# Patient Record
Sex: Female | Born: 1945 | Hispanic: Yes | Marital: Single | Smoking: Never smoker
Health system: Southern US, Community
[De-identification: ages and names within clinical notes are randomized; demographics above are authoritative.]

## PROBLEM LIST (undated history)

## (undated) DIAGNOSIS — E119 Type 2 diabetes mellitus without complications: Secondary | ICD-10-CM

## (undated) DIAGNOSIS — M1990 Unspecified osteoarthritis, unspecified site: Secondary | ICD-10-CM

## (undated) DIAGNOSIS — K219 Gastro-esophageal reflux disease without esophagitis: Secondary | ICD-10-CM

## (undated) DIAGNOSIS — I10 Essential (primary) hypertension: Secondary | ICD-10-CM

## (undated) HISTORY — DX: Gastro-esophageal reflux disease without esophagitis: K21.9

## (undated) HISTORY — PX: HERNIA REPAIR: SHX51

## (undated) HISTORY — DX: Essential (primary) hypertension: I10

## (undated) HISTORY — DX: Unspecified osteoarthritis, unspecified site: M19.90

---

## 2009-07-30 ENCOUNTER — Emergency Department (HOSPITAL_COMMUNITY): Admission: EM | Admit: 2009-07-30 | Discharge: 2009-07-30 | Payer: Self-pay | Admitting: Emergency Medicine

## 2009-11-15 ENCOUNTER — Emergency Department (HOSPITAL_COMMUNITY): Admission: EM | Admit: 2009-11-15 | Discharge: 2009-11-15 | Payer: Self-pay | Admitting: Family Medicine

## 2009-12-25 ENCOUNTER — Encounter (INDEPENDENT_AMBULATORY_CARE_PROVIDER_SITE_OTHER): Payer: Self-pay | Admitting: Family Medicine

## 2009-12-25 ENCOUNTER — Ambulatory Visit: Payer: Self-pay | Admitting: Internal Medicine

## 2009-12-25 LAB — CONVERTED CEMR LAB
ALT: 24 units/L (ref 0–35)
AST: 23 units/L (ref 0–37)
Alkaline Phosphatase: 100 units/L (ref 39–117)
BUN: 21 mg/dL (ref 6–23)
Basophils Relative: 0 % (ref 0–1)
Calcium: 9.1 mg/dL (ref 8.4–10.5)
Cholesterol: 186 mg/dL (ref 0–200)
Creatinine, Ser: 0.84 mg/dL (ref 0.40–1.20)
Eosinophils Absolute: 0.1 10*3/uL (ref 0.0–0.7)
Glucose, Bld: 102 mg/dL — ABNORMAL HIGH (ref 70–99)
HCT: 42 % (ref 36.0–46.0)
Hemoglobin: 14.1 g/dL (ref 12.0–15.0)
MCHC: 33.6 g/dL (ref 30.0–36.0)
MCV: 88.2 fL (ref 78.0–100.0)
Monocytes Relative: 6 % (ref 3–12)
Neutrophils Relative %: 39 % — ABNORMAL LOW (ref 43–77)
RBC: 4.76 M/uL (ref 3.87–5.11)
Triglycerides: 94 mg/dL (ref <150)
VLDL: 19 mg/dL (ref 0–40)
Vit D, 25-Hydroxy: 43 ng/mL (ref 30–89)
WBC: 6.8 10*3/uL (ref 4.0–10.5)

## 2009-12-28 ENCOUNTER — Encounter (INDEPENDENT_AMBULATORY_CARE_PROVIDER_SITE_OTHER): Payer: Self-pay | Admitting: Family Medicine

## 2009-12-28 LAB — CONVERTED CEMR LAB: Hgb A1c MFr Bld: 7.2 % — ABNORMAL HIGH (ref <5.7)

## 2010-05-29 ENCOUNTER — Encounter (INDEPENDENT_AMBULATORY_CARE_PROVIDER_SITE_OTHER): Payer: Self-pay | Admitting: *Deleted

## 2010-05-29 LAB — CONVERTED CEMR LAB
ALT: 19 units/L (ref 0–35)
Albumin: 4.4 g/dL (ref 3.5–5.2)
Glucose, Bld: 131 mg/dL — ABNORMAL HIGH (ref 70–99)
HDL: 54 mg/dL (ref >39)
Hgb A1c MFr Bld: 7.7 % — ABNORMAL HIGH (ref <5.7)
Potassium: 4.5 meq/L (ref 3.5–5.3)
Sodium: 141 meq/L (ref 135–145)
Total Bilirubin: 0.5 mg/dL (ref 0.3–1.2)
Total Protein: 7.6 g/dL (ref 6.0–8.3)
Triglycerides: 92 mg/dL (ref <150)
VLDL: 18 mg/dL (ref 0–40)

## 2011-02-06 ENCOUNTER — Encounter: Payer: Self-pay | Admitting: Internal Medicine

## 2011-03-05 ENCOUNTER — Ambulatory Visit (INDEPENDENT_AMBULATORY_CARE_PROVIDER_SITE_OTHER): Payer: Medicaid Other | Admitting: Internal Medicine

## 2011-03-05 ENCOUNTER — Encounter: Payer: Self-pay | Admitting: Internal Medicine

## 2011-03-05 DIAGNOSIS — R1013 Epigastric pain: Secondary | ICD-10-CM | POA: Insufficient documentation

## 2011-03-05 DIAGNOSIS — K635 Polyp of colon: Secondary | ICD-10-CM | POA: Insufficient documentation

## 2011-03-05 DIAGNOSIS — K625 Hemorrhage of anus and rectum: Secondary | ICD-10-CM

## 2011-03-05 DIAGNOSIS — Z86010 Personal history of colonic polyps: Secondary | ICD-10-CM

## 2011-03-05 DIAGNOSIS — K3189 Other diseases of stomach and duodenum: Secondary | ICD-10-CM

## 2011-03-05 DIAGNOSIS — I10 Essential (primary) hypertension: Secondary | ICD-10-CM | POA: Insufficient documentation

## 2011-03-05 DIAGNOSIS — E119 Type 2 diabetes mellitus without complications: Secondary | ICD-10-CM | POA: Insufficient documentation

## 2011-03-05 MED ORDER — ESOMEPRAZOLE MAGNESIUM 40 MG PO CPDR: 40.0000 mg | DELAYED_RELEASE_CAPSULE | Freq: Every day | ORAL | Status: DC

## 2011-03-05 MED ORDER — PEG-KCL-NACL-NASULF-NA ASC-C 100 G PO SOLR: 1.0000 | ORAL | Status: DC

## 2011-03-05 NOTE — Progress Notes (Signed)
Subjective:    Patient ID: Barbara Grant, female    DOB: [birth year], 65 y.o.   MRN: [id number]  HPI Barbara Grant is a 65 yo female with a past medical history of diabetes, hypertension, and osteoporosis who seen in consultation at the request of Dr. Lovell Sheehan for evaluation of epigastric abdominal pain and intermittent blood in stool. The patient reports 2-3 months of burning epigastric pain. This is worse with eating. She does not have the pain when she is fasting. She reports occasional heartburn. No nausea or vomiting. No early satiety. No dysphagia or odynophagia. She reports her appetite remains good and her weight has been stable. She was given a prescription for Nexium 40 mg daily and she's been using this medication. It has helped some with her epigastric burning.  She also reports occasional bright red blood in her stool. This is not associated with any pain. She does not know whether or not she has previously been diagnosed with hemorrhoids. She states most recently her bowel movements have been normal, and for her this is approximately one stool daily. She denies melena. She reports she had a colonoscopy 7 years ago in her home country of the [place], and she remembers being told that she had several polyps removed.   Review of Systems Constitutional: Negative for fever, chills, night sweats, activity change, appetite change and unexpected weight change HEENT: Negative for sore throat, mouth sores and trouble swallowing. Eyes: Negative for visual disturbance Respiratory: Negative for cough, chest tightness and shortness of breath Cardiovascular: Negative for chest pain, palpitations and lower extremity swelling Gastrointestinal: See history of present illness Genitourinary: Negative for dysuria and hematuria. Musculoskeletal: Negative for back pain, arthralgias and myalgias Skin: Negative for rash or color change Neurological: Negative for headaches, weakness,  numbness Hematological: Negative for adenopathy, negative for easy bruising/bleeding Psychiatric/behavioral: Negative for depressed mood, negative for anxiety  Past Medical History  Diagnosis Date  . GERD (gastroesophageal reflux disease)   . HTN (hypertension)   . Osteoporosis   . Diabetes mellitus    Current Outpatient Prescriptions  Medication Sig Dispense Refill  . alendronate (FOSAMAX) 70 MG tablet Take 70 mg by mouth every 7 (seven) days. Take with a full glass of water on an empty stomach.       . esomeprazole (NEXIUM) 40 MG capsule Take 40 mg by mouth daily before breakfast.        . hydrochlorothiazide (HYDRODIURIL) 25 MG tablet Take 25 mg by mouth daily.        . metFORMIN (GLUCOPHAGE) 500 MG tablet Take 500 mg by mouth daily with breakfast.        . peg 3350 powder (MOVIPREP) 100 G SOLR Take 1 kit (100 g total) by mouth as directed. See written handout  1 kit  0   No Known Allergies   Social History  . Marital Status: Single    Number of Children: 1   Occupational History  . Hair Stylist    Social History Main Topics  . Smoking status: Never Smoker   . Smokeless tobacco: Never Used  . Alcohol Use: No  . Drug Use: No  . Sexually Active: None   Family History  Problem Relation Age of Onset  . Colon cancer Mother   . Colon cancer Sister   . Diabetes Sister        Objective:   Physical Exam BP 116/74  Pulse 64  Ht 5\' 6"  (1.676 m)  Wt 166 lb (75.297 kg)  BMI 26.79 kg/m2 Constitutional: Well-developed and well-nourished. No distress. HEENT: Normocephalic and atraumatic. Oropharynx is clear and moist. No oropharyngeal exudate. Conjunctivae are normal. Pupils are equal round and reactive to light. No scleral icterus. Neck: Neck supple. Trachea midline. Cardiovascular: Normal rate, regular rhythm and intact distal pulses. No M/R/G Pulmonary/chest: Effort normal and breath sounds normal. No wheezing, rales or rhonchi. Abdominal: Soft, nontender, nondistended.  Bowel sounds active throughout. There are no masses palpable. No hepatosplenomegaly. Lower midline vertical abdominal scar well-healed. Extremities: no clubbing, cyanosis, or edema Lymphadenopathy: No cervical adenopathy noted. Neurological: Alert and oriented to person place and time. Skin: Skin is warm and dry. No rashes noted. Psychiatric: Normal mood and affect. Behavior is normal.     Assessment & Plan:  65 yo female with a past medical history of diabetes, hypertension, and osteoporosis who seen in consultation at the request of Dr. Lovell Sheehan for evaluation of epigastric abdominal pain and intermittent blood in stool.  1. Epigastric abdominal pain -- the patient's symptoms are consistent with dyspepsia. It is responded somewhat to PPI therapy. Given her age we will proceed with EGD for further evaluation. The plan will be to perform gastric biopsies to exclude H. pylori is no obvious pathology is seen. I've recommended that she continue Nexium 40 mg daily. She was given samples and a prescription with 5 refills today.  2. Intermittent rectal bleeding/History of colon polyps -- given the patient's history of colon polyps, family history of colorectal cancer, and intermittent rectal bleeding colonoscopy is indicated. We'll schedule this for her on the same day as her upper endoscopy. She is agreeable to proceed.

## 2011-03-05 NOTE — Patient Instructions (Signed)
You have been scheduled for an Endoscopy and Colonoscopy, instructions have been provided. Your prep has been sent to your pharmacy. Your Nexium has been sent to your pharmacy and samples given.

## 2011-03-12 ENCOUNTER — Telehealth: Payer: Self-pay | Admitting: Internal Medicine

## 2011-03-13 ENCOUNTER — Encounter: Payer: Self-pay | Admitting: Internal Medicine

## 2011-03-13 MED ORDER — LANSOPRAZOLE 30 MG PO CPDR: DELAYED_RELEASE_CAPSULE | ORAL | Status: DC

## 2011-03-13 MED ORDER — PEG 3350-KCL-NA BICARB-NACL 420 G PO SOLR: 4000.0000 mL | Freq: Once | ORAL | Status: AC

## 2011-03-13 NOTE — Telephone Encounter (Signed)
Call Medicaid, Medicare, etc and Movi Prep and Nexium aren't covered. Ok with Dr Rhea Belton to order Prevacid and Barbara Grant; ordered

## 2011-03-13 NOTE — Telephone Encounter (Signed)
Error

## 2011-04-01 ENCOUNTER — Ambulatory Visit (AMBULATORY_SURGERY_CENTER): Payer: Medicaid Other | Admitting: Internal Medicine

## 2011-04-01 ENCOUNTER — Encounter: Payer: Self-pay | Admitting: Internal Medicine

## 2011-04-01 ENCOUNTER — Other Ambulatory Visit: Payer: Self-pay

## 2011-04-01 DIAGNOSIS — K2940 Chronic atrophic gastritis without bleeding: Secondary | ICD-10-CM

## 2011-04-01 DIAGNOSIS — K3189 Other diseases of stomach and duodenum: Secondary | ICD-10-CM

## 2011-04-01 DIAGNOSIS — Z1211 Encounter for screening for malignant neoplasm of colon: Secondary | ICD-10-CM

## 2011-04-01 DIAGNOSIS — Z860100 Personal history of colon polyps, unspecified: Secondary | ICD-10-CM

## 2011-04-01 DIAGNOSIS — K625 Hemorrhage of anus and rectum: Secondary | ICD-10-CM

## 2011-04-01 DIAGNOSIS — K2970 Gastritis, unspecified, without bleeding: Secondary | ICD-10-CM

## 2011-04-01 LAB — GLUCOSE, CAPILLARY: Glucose-Capillary: 104 mg/dL — ABNORMAL HIGH (ref 70–99)

## 2011-04-01 MED ORDER — SODIUM CHLORIDE 0.9 % IV SOLN: 500.0000 mL | INTRAVENOUS | Status: DC

## 2011-04-02 ENCOUNTER — Telehealth: Payer: Self-pay | Admitting: *Deleted

## 2011-04-02 NOTE — Telephone Encounter (Signed)
Called pt at listed number [phone], wrong number.

## 2011-04-16 ENCOUNTER — Encounter: Payer: Self-pay | Admitting: Internal Medicine

## 2011-09-04 ENCOUNTER — Emergency Department (HOSPITAL_COMMUNITY): Payer: Medicare Other

## 2011-09-04 ENCOUNTER — Emergency Department (HOSPITAL_COMMUNITY)
Admission: EM | Admit: 2011-09-04 | Discharge: 2011-09-04 | Disposition: A | Discharge status: Home / Self Care | Payer: Medicare Other | Attending: Emergency Medicine | Admitting: Emergency Medicine

## 2011-09-04 ENCOUNTER — Encounter (HOSPITAL_COMMUNITY): Payer: Self-pay | Admitting: Radiology

## 2011-09-04 DIAGNOSIS — K7689 Other specified diseases of liver: Secondary | ICD-10-CM | POA: Insufficient documentation

## 2011-09-04 DIAGNOSIS — R10819 Abdominal tenderness, unspecified site: Secondary | ICD-10-CM | POA: Insufficient documentation

## 2011-09-04 DIAGNOSIS — E119 Type 2 diabetes mellitus without complications: Secondary | ICD-10-CM | POA: Insufficient documentation

## 2011-09-04 DIAGNOSIS — R109 Unspecified abdominal pain: Secondary | ICD-10-CM | POA: Insufficient documentation

## 2011-09-04 DIAGNOSIS — I10 Essential (primary) hypertension: Secondary | ICD-10-CM | POA: Insufficient documentation

## 2011-09-04 DIAGNOSIS — Z79899 Other long term (current) drug therapy: Secondary | ICD-10-CM | POA: Insufficient documentation

## 2011-09-04 LAB — CBC
Hemoglobin: 12.7 g/dL (ref 12.0–15.0)
MCHC: 33.7 g/dL (ref 30.0–36.0)
MCV: 89.1 fL (ref 78.0–100.0)
Platelets: 232 10*3/uL (ref 150–400)
RBC: 4.23 MIL/uL (ref 3.87–5.11)
RDW: 13.9 % (ref 11.5–15.5)
WBC: 9.2 10*3/uL (ref 4.0–10.5)

## 2011-09-04 LAB — URINALYSIS, ROUTINE W REFLEX MICROSCOPIC
Glucose, UA: NEGATIVE mg/dL
Hgb urine dipstick: NEGATIVE
Leukocytes, UA: NEGATIVE
Protein, ur: NEGATIVE mg/dL
Specific Gravity, Urine: 1.019 (ref 1.005–1.030)
Urobilinogen, UA: 1 mg/dL (ref 0.0–1.0)

## 2011-09-04 LAB — DIFFERENTIAL
Eosinophils Relative: 0 % (ref 0–5)
Monocytes Relative: 5 % (ref 3–12)

## 2011-09-04 LAB — BASIC METABOLIC PANEL
BUN: 18 mg/dL (ref 6–23)
CO2: 27 mEq/L (ref 19–32)
Calcium: 9.2 mg/dL (ref 8.4–10.5)
Chloride: 98 mEq/L (ref 96–112)
GFR calc Af Amer: >90 mL/min (ref >90)
GFR calc non Af Amer: 86 mL/min — ABNORMAL LOW (ref >90)
Potassium: 3.3 mEq/L — ABNORMAL LOW (ref 3.5–5.1)
Sodium: 136 mEq/L (ref 135–145)

## 2011-09-04 MED ORDER — ONDANSETRON HCL 4 MG/2ML IJ SOLN
4.0000 mg | Freq: Once | INTRAMUSCULAR | Status: AC
  Administered 2011-09-04: 4 mg via INTRAVENOUS
  Filled 2011-09-04: qty 2

## 2011-09-04 MED ORDER — HYDROMORPHONE HCL PF 1 MG/ML IJ SOLN
0.5000 mg | Freq: Once | INTRAMUSCULAR | Status: AC
  Administered 2011-09-04: 0.5 mg via INTRAVENOUS
  Filled 2011-09-04: qty 1

## 2011-09-04 MED ORDER — OXYCODONE-ACETAMINOPHEN 5-325 MG PO TABS: 1.0000 | ORAL_TABLET | ORAL | Status: AC | PRN

## 2011-09-04 MED ORDER — ONDANSETRON HCL 4 MG PO TABS: 4.0000 mg | ORAL_TABLET | Freq: Four times a day (QID) | ORAL | Status: DC

## 2011-09-04 MED ORDER — IOHEXOL 300 MG/ML  SOLN
100.0000 mL | Freq: Once | INTRAMUSCULAR | Status: AC | PRN
  Administered 2011-09-04: 100 mL via INTRAVENOUS

## 2011-09-04 MED ORDER — SODIUM CHLORIDE 0.9 % IV BOLUS (SEPSIS)
500.0000 mL | Freq: Once | INTRAVENOUS | Status: AC
  Administered 2011-09-04: 500 mL via INTRAVENOUS

## 2011-09-04 MED ORDER — ONDANSETRON HCL 4 MG PO TABS: 4.0000 mg | ORAL_TABLET | Freq: Four times a day (QID) | ORAL | Status: AC

## 2011-09-04 NOTE — ED Notes (Signed)
ZOX:[id number]<AV> Expected date:09/04/11<BR> Expected time: 9:45 AM<BR> Means of arrival:Ambulance<BR> Comments:<BR> abd pain

## 2011-09-04 NOTE — ED Provider Notes (Signed)
History     CSN: [id number]  Arrival date & time 09/04/11  1478   First MD Initiated Contact with Patient 09/04/11 1011      No chief complaint on file.   (Consider location/radiation/quality/duration/timing/severity/associated sxs/prior treatment) HPI Language: Spanish Pt presents to the ED with complaints of abdominal pain since last week that has been intermittent. She states that it came on very strong today. She did not have nausea of vomiting but did have diarrhea with momentary relief afterwards. She denies having weakness, vaginal discharge or complaints, urinary symptoms. She states that the pain is very bad and LLQ. She admits to being very gasey with out relief after passing flatus. She is in NAD at the time and is answering questions appropriately.   Past Medical History  Diagnosis Date  . GERD (gastroesophageal reflux disease)   . HTN (hypertension)   . Osteoporosis   . Diabetes mellitus     Past Surgical History  Procedure Date  . Hernia repair     Family History  Problem Relation Age of Onset  . Colon cancer Mother   . Colon cancer Sister   . Diabetes Sister     History  Substance Use Topics  . Smoking status: Never Smoker   . Smokeless tobacco: Never Used  . Alcohol Use: No    OB History    Grav Para Term Preterm Abortions TAB SAB Ect Mult Living                  Review of Systems  All other systems reviewed and are negative.    Allergies  Review of patient's allergies indicates no known allergies.  Home Medications   Current Outpatient Rx  Name Route Sig Dispense Refill  . ALENDRONATE SODIUM 70 MG PO TABS Oral Take 70 mg by mouth every 7 (seven) days. Take with a full glass of water on an empty stomach.    Marland Kitchen HYDROCHLOROTHIAZIDE 25 MG PO TABS Oral Take 25 mg by mouth daily.    Marland Kitchen LANSOPRAZOLE 30 MG PO CPDR Oral Take 30 mg by mouth daily.    Marland Kitchen METFORMIN HCL 500 MG PO TABS Oral Take 500 mg by mouth daily with breakfast.     . ADULT  MULTIVITAMIN W/MINERALS CH Oral Take 1 tablet by mouth daily.      BP 111/64  Pulse 74  Temp 98 F (36.7 C)  Resp 20  SpO2 98%  Physical Exam  Nursing note and vitals reviewed. Constitutional: She appears well-developed and well-nourished. No distress.  HENT:  Head: Normocephalic and atraumatic.  Eyes: Pupils are equal, round, and reactive to light.  Neck: Normal range of motion. Neck supple.  Cardiovascular: Normal rate and regular rhythm.   Pulmonary/Chest: Effort normal.  Abdominal: Soft. Bowel sounds are normal. She exhibits no distension and no mass. There is tenderness (suprapubic and LLQ). There is no rebound and no guarding.  Neurological: She is alert.  Skin: Skin is warm and dry.    ED Course  Procedures (including critical care time)  Labs Reviewed  BASIC METABOLIC PANEL - Abnormal; Notable for the following:    Potassium 3.3 (*)    Glucose, Bld 195 (*)    GFR calc non Af Amer 86 (*)    All other components within normal limits  URINALYSIS, ROUTINE W REFLEX MICROSCOPIC - Abnormal; Notable for the following:    pH 8.5 (*)    All other components within normal limits  CBC  DIFFERENTIAL  URINE  CULTURE   Ct Abdomen Pelvis W Contrast  09/04/2011  *RADIOLOGY REPORT*  Clinical Data: Pain  CT ABDOMEN AND PELVIS WITH CONTRAST  Technique:  Multidetector CT imaging of the abdomen and pelvis was performed following the standard protocol during bolus administration of intravenous contrast.  Contrast: OMNIPAQUE IOHEXOL 300 MG/ML  SOLN  Comparison: None.  Findings: Mild paraseptal emphysematous changes are noted at the lung bases.  There is diffuse fatty infiltration of the liver. There are no focal liver lesions.  The spleen, pancreas, adrenal glands, kidneys, urinary bladder, uterus have a normal appearance. The bowel is unremarkable with no evidence of gross inflammation or obstruction.  The appendix is seen in the right lower quadrant and has a normal appearance.  There  is no adenopathy or free fluid within the abdomen or pelvis.  There are no osseous lesions.  IMPRESSION: Fatty infiltration of the liver.  Original Report Authenticated By: Brandon Melnick, M.D.     1. Abdominal pain       MDM  pts laboratory work-up and CT abd/pelv with contrast has come back with no acute findings. I have discussed patient with Dr. Rubin Payor who agrees with my plan that the patient can be discharged with pain and nausea medication with close follow-up with GI.  Pt has been advised of the symptoms that warrant their return to the ED. Patient has voiced understanding and has agreed to follow-up with the PCP or specialist.         Dorthula Matas, PA 09/04/11 1408

## 2011-09-04 NOTE — ED Notes (Signed)
Per ems, pt c/o mid abd pain, no nausea, no vomiting. CBG 138,

## 2011-09-04 NOTE — ED Provider Notes (Signed)
Medical screening examination/treatment/procedure(s) were performed by non-physician practitioner and as supervising physician I was immediately available for consultation/collaboration.  Abdulhadi Stopa R. Zygmunt Mcglinn, MD 09/04/11 1527 

## 2011-09-04 NOTE — ED Notes (Signed)
Per interpreter, pt c/o lower abdominal pain since last week on and off and came back worst today, no nausea, no vomiting, little diarrhea started today, denied weakness, no vaginal discharge, no urinary symptoms.

## 2011-09-04 NOTE — ED Notes (Signed)
PA at bedside.

## 2011-09-05 LAB — URINE CULTURE: Culture  Setup Time: 201304190130

## 2012-04-13 ENCOUNTER — Other Ambulatory Visit: Payer: Self-pay | Admitting: Neurology

## 2012-04-13 DIAGNOSIS — R42 Dizziness and giddiness: Secondary | ICD-10-CM

## 2012-04-13 DIAGNOSIS — E119 Type 2 diabetes mellitus without complications: Secondary | ICD-10-CM

## 2012-04-13 DIAGNOSIS — E785 Hyperlipidemia, unspecified: Secondary | ICD-10-CM

## 2012-04-13 DIAGNOSIS — R51 Headache: Secondary | ICD-10-CM

## 2012-07-28 ENCOUNTER — Emergency Department (HOSPITAL_COMMUNITY)
Admission: EM | Admit: 2012-07-28 | Discharge: 2012-07-28 | Disposition: A | Discharge status: Home / Self Care | Payer: Medicare Other | Attending: Emergency Medicine | Admitting: Emergency Medicine

## 2012-07-28 ENCOUNTER — Encounter (HOSPITAL_COMMUNITY): Payer: Self-pay | Admitting: *Deleted

## 2012-07-28 DIAGNOSIS — Z8742 Personal history of other diseases of the female genital tract: Secondary | ICD-10-CM | POA: Insufficient documentation

## 2012-07-28 DIAGNOSIS — Z9889 Other specified postprocedural states: Secondary | ICD-10-CM | POA: Insufficient documentation

## 2012-07-28 DIAGNOSIS — R109 Unspecified abdominal pain: Secondary | ICD-10-CM | POA: Insufficient documentation

## 2012-07-28 DIAGNOSIS — R350 Frequency of micturition: Secondary | ICD-10-CM | POA: Insufficient documentation

## 2012-07-28 DIAGNOSIS — N390 Urinary tract infection, site not specified: Secondary | ICD-10-CM | POA: Insufficient documentation

## 2012-07-28 DIAGNOSIS — E119 Type 2 diabetes mellitus without complications: Secondary | ICD-10-CM | POA: Insufficient documentation

## 2012-07-28 DIAGNOSIS — K219 Gastro-esophageal reflux disease without esophagitis: Secondary | ICD-10-CM | POA: Insufficient documentation

## 2012-07-28 DIAGNOSIS — Z79899 Other long term (current) drug therapy: Secondary | ICD-10-CM | POA: Insufficient documentation

## 2012-07-28 DIAGNOSIS — M545 Low back pain, unspecified: Secondary | ICD-10-CM | POA: Insufficient documentation

## 2012-07-28 DIAGNOSIS — I10 Essential (primary) hypertension: Secondary | ICD-10-CM | POA: Insufficient documentation

## 2012-07-28 DIAGNOSIS — E669 Obesity, unspecified: Secondary | ICD-10-CM | POA: Insufficient documentation

## 2012-07-28 DIAGNOSIS — Z87442 Personal history of urinary calculi: Secondary | ICD-10-CM | POA: Insufficient documentation

## 2012-07-28 DIAGNOSIS — M810 Age-related osteoporosis without current pathological fracture: Secondary | ICD-10-CM | POA: Insufficient documentation

## 2012-07-28 LAB — URINALYSIS, ROUTINE W REFLEX MICROSCOPIC
Bilirubin Urine: NEGATIVE
Nitrite: NEGATIVE
Specific Gravity, Urine: 1.003 — ABNORMAL LOW (ref 1.005–1.030)
pH: 7.5 (ref 5.0–8.0)

## 2012-07-28 LAB — URINE MICROSCOPIC-ADD ON

## 2012-07-28 MED ORDER — HYDROMORPHONE HCL PF 1 MG/ML IJ SOLN
0.5000 mg | Freq: Once | INTRAMUSCULAR | Status: AC
  Administered 2012-07-28: 0.5 mg via INTRAVENOUS
  Filled 2012-07-28: qty 1

## 2012-07-28 MED ORDER — CIPROFLOXACIN HCL 500 MG PO TABS
500.0000 mg | ORAL_TABLET | Freq: Once | ORAL | Status: AC
  Administered 2012-07-28: 500 mg via ORAL
  Filled 2012-07-28: qty 1

## 2012-07-28 MED ORDER — CIPROFLOXACIN HCL 500 MG PO TABS: 500.0000 mg | ORAL_TABLET | Freq: Two times a day (BID) | ORAL | Status: DC

## 2012-07-28 NOTE — ED Provider Notes (Signed)
History     CSN: [id number]  Arrival date & time 07/28/12  1148   First MD Initiated Contact with Patient 07/28/12 1326      Chief Complaint  Patient presents with  . Dysuria  . Abdominal Pain    (Consider location/radiation/quality/duration/timing/severity/associated sxs/prior treatment) Patient is a 67 y.o. female presenting with dysuria and abdominal pain. The history is provided by the patient.  Dysuria  This is a new problem. The current episode started more than 2 days ago. The problem occurs every urination. The problem has not changed since onset.The quality of the pain is described as burning. The pain is at a severity of 5/10. The pain is moderate. There has been no fever. She is not sexually active. History of pyelonephritis: unsure but does not believe so. Associated symptoms include frequency. Pertinent negatives include no chills, no sweats, no nausea, no vomiting, no hematuria, no hesitancy, no possible pregnancy, no urgency and no flank pain. She has tried increased fluids for the symptoms. Her past medical history is significant for kidney stones. Her past medical history does not include single kidney, urological procedure, recurrent UTIs, urinary stasis or catheterization. Past medical history comments: Possible kidney stone within the last year. .  Abdominal Pain Pain location:  Suprapubic Pain quality: burning   Pain radiates to:  Back (lumbar region) Pain severity:  Moderate Onset quality:  Sudden Duration:  3 days Timing:  Constant Progression:  Unchanged Chronicity:  New Context: eating   Context: not diet changes, not recent illness, not recent sexual activity, not retching and not suspicious food intake   Relieved by:  Nothing Worsened by:  Urination Ineffective treatments:  Liquids Associated symptoms: dysuria   Associated symptoms: no anorexia, no belching, no chest pain, no chills, no constipation, no cough, no diarrhea, no fatigue, no fever, no  hematemesis, no hematochezia, no hematuria, no melena, no nausea, no shortness of breath, no sore throat, no vaginal bleeding, no vaginal discharge and no vomiting   Risk factors: obesity   Risk factors: no alcohol abuse, no aspirin use, has not had multiple surgeries, no NSAID use, not pregnant and no recent hospitalization     Past Medical History  Diagnosis Date  . GERD (gastroesophageal reflux disease)   . HTN (hypertension)   . Osteoporosis   . Diabetes mellitus     Past Surgical History  Procedure Laterality Date  . Hernia repair      Family History  Problem Relation Age of Onset  . Colon cancer Mother   . Colon cancer Sister   . Diabetes Sister     History  Substance Use Topics  . Smoking status: Never Smoker   . Smokeless tobacco: Never Used  . Alcohol Use: No    OB History   Grav Para Term Preterm Abortions TAB SAB Ect Mult Living                  Review of Systems  Constitutional: Negative for fever, chills, diaphoresis, appetite change, fatigue and unexpected weight change.  HENT: Negative for sore throat.   Respiratory: Negative for cough and shortness of breath.   Cardiovascular: Negative for chest pain.  Gastrointestinal: Positive for abdominal pain. Negative for nausea, vomiting, diarrhea, constipation, blood in stool, melena, hematochezia, abdominal distention, anal bleeding, rectal pain, anorexia and hematemesis.  Genitourinary: Positive for dysuria and frequency. Negative for hesitancy, urgency, hematuria, flank pain, decreased urine volume, vaginal bleeding, vaginal discharge, enuresis, difficulty urinating, vaginal pain, menstrual problem and dyspareunia.  Musculoskeletal: Positive for back pain.    Allergies  Review of patient's allergies indicates no known allergies.  Home Medications   Current Outpatient Rx  Name  Route  Sig  Dispense  Refill  . alendronate (FOSAMAX) 70 MG tablet   Oral   Take 70 mg by mouth every 7 (seven) days. Take  with a full glass of water on an empty stomach.         . bisoprolol-hydrochlorothiazide (ZIAC) 5-6.25 MG per tablet   Oral   Take 1 tablet by mouth daily.         . lansoprazole (PREVACID) 30 MG capsule   Oral   Take 30 mg by mouth daily.         Marland Kitchen lisinopril (PRINIVIL,ZESTRIL) 5 MG tablet   Oral   Take 5 mg by mouth daily.         . metFORMIN (GLUCOPHAGE) 500 MG tablet   Oral   Take 500 mg by mouth daily with breakfast.          . Multiple Vitamin (MULITIVITAMIN WITH MINERALS) TABS   Oral   Take 1 tablet by mouth daily.           BP 152/93  Pulse 86  Temp(Src) 98.1 F (36.7 C) (Oral)  Resp 22  SpO2 100%  Physical Exam  Constitutional: She is oriented to person, place, and time. She appears well-developed and well-nourished.  HENT:  Head: Normocephalic and atraumatic.  Cardiovascular: Normal rate, regular rhythm and normal heart sounds.   Pulmonary/Chest: Effort normal and breath sounds normal.  Abdominal: Soft. Bowel sounds are normal. She exhibits no distension, no fluid wave, no ascites and no mass. There is no hepatosplenomegaly, splenomegaly or hepatomegaly. There is tenderness in the suprapubic area. There is no rigidity, no rebound, no guarding, no CVA tenderness, no tenderness at McBurney's point and negative Murphy's sign.  Musculoskeletal:       Lumbar back: She exhibits pain.       Back:  Pain radiating directly back to lower lumbar region from suprapubic pain.   Neurological: She is alert and oriented to person, place, and time.  Psychiatric: She has a normal mood and affect.    ED Course  Procedures (including critical care time)  Labs Reviewed  URINALYSIS, ROUTINE W REFLEX MICROSCOPIC - Abnormal; Notable for the following:    APPearance CLOUDY (*)    Specific Gravity, Urine 1.003 (*)    Hgb urine dipstick LARGE (*)    Protein, ur 30 (*)    Leukocytes, UA LARGE (*)    All other components within normal limits  URINE MICROSCOPIC-ADD  ON - Abnormal; Notable for the following:    Squamous Epithelial / LPF FEW (*)    Bacteria, UA FEW (*)    All other components within normal limits  URINE CULTURE   No results found.   No diagnosis found.  Culture Report: 67 yo female diagnosed with UTI and treated with 500 mg of Cipro in the ED as discussed with my attending. Patient will go home with a prescription for Cipro 500 mg once a day for a total of three days of treatment including today.   MDM  Patient is a 67 yo female presented with three days of suprapubic pain along with dysuria. Based on history, physical exam, and U/A patient was treated as noted above in ED Course. Urine Culture is still pending. No concern for pyelonephritis or appendicitis because patient did not have fever, nausea,  vomiting, anorexia, guarding, rebound tenderness, surgical abdomen, or CVA tenderness.  Patient was instructed to follow up with her PCP in 1-2 days. Patient was also instructed to return to the ED if she develops a fever, RLQ pain, nausea or vomiting, or any concerning signs or symptoms. Patient was also instructed to take Cipro as instructed until course was completely finished.            Jeannetta Ellis, PA-C 07/28/12 1528

## 2012-07-28 NOTE — ED Notes (Signed)
Pt c/o lower abd and bil flank pain x3 days. C/o severe dysuria.

## 2012-07-29 NOTE — ED Provider Notes (Signed)
Medical screening examination/treatment/procedure(s) were performed by non-physician practitioner and as supervising physician I was immediately available for consultation/collaboration.  Marwan T Powers, MD 07/29/12 1006 

## 2012-07-30 LAB — URINE CULTURE

## 2012-07-31 ENCOUNTER — Telehealth (HOSPITAL_COMMUNITY): Payer: Self-pay | Admitting: Emergency Medicine

## 2012-07-31 NOTE — ED Notes (Signed)
+  Urine. Patient treated with Cipro. Sensitive to same. Per protocol MD. °

## 2012-07-31 NOTE — ED Notes (Signed)
Patient has +Urine culture. Checking to see if appropriately treated. °

## 2012-12-14 ENCOUNTER — Ambulatory Visit (INDEPENDENT_AMBULATORY_CARE_PROVIDER_SITE_OTHER): Payer: Medicare Other | Admitting: Family Medicine

## 2012-12-14 VITALS — BP 120/80 | HR 65 | Temp 97.8°F | Resp 16 | Ht 64.5 in | Wt 160.6 lb

## 2012-12-14 DIAGNOSIS — E119 Type 2 diabetes mellitus without complications: Secondary | ICD-10-CM

## 2012-12-14 DIAGNOSIS — I10 Essential (primary) hypertension: Secondary | ICD-10-CM

## 2012-12-14 DIAGNOSIS — M810 Age-related osteoporosis without current pathological fracture: Secondary | ICD-10-CM

## 2012-12-14 DIAGNOSIS — K219 Gastro-esophageal reflux disease without esophagitis: Secondary | ICD-10-CM

## 2012-12-14 LAB — POCT CBC
HCT, POC: 43.5 % (ref 37.7–47.9)
Hemoglobin: 13.8 g/dL (ref 12.2–16.2)
Lymph, poc: 3.1 (ref 0.6–3.4)
MCH, POC: 29.9 pg (ref 27–31.2)
MCHC: 31.7 g/dL — AB (ref 31.8–35.4)
MCV: 94.3 fL (ref 80–97)
MPV: 9.8 fL (ref 0–99.8)
POC MID %: 7.7 %M (ref 0–12)
RBC: 4.61 M/uL (ref 4.04–5.48)
WBC: 6.9 10*3/uL (ref 4.6–10.2)

## 2012-12-14 MED ORDER — LISINOPRIL 5 MG PO TABS: 5.0000 mg | ORAL_TABLET | Freq: Every day | ORAL | Status: AC

## 2012-12-14 MED ORDER — METFORMIN HCL 500 MG PO TABS: 500.0000 mg | ORAL_TABLET | Freq: Every day | ORAL | Status: DC

## 2012-12-14 MED ORDER — ALENDRONATE SODIUM 70 MG PO TABS: 70.0000 mg | ORAL_TABLET | ORAL | Status: AC

## 2012-12-14 MED ORDER — LANSOPRAZOLE 30 MG PO CPDR: 30.0000 mg | DELAYED_RELEASE_CAPSULE | Freq: Every day | ORAL | Status: DC

## 2012-12-14 MED ORDER — BISOPROLOL-HYDROCHLOROTHIAZIDE 5-6.25 MG PO TABS: 1.0000 | ORAL_TABLET | Freq: Every day | ORAL | Status: AC

## 2012-12-14 NOTE — Patient Instructions (Addendum)
I will be in touch when your labs come in.  Take care

## 2012-12-14 NOTE — Progress Notes (Addendum)
[facility] [address], [place] [address] [phone]  Date:  12/14/2012   Name:  Barbara Grant   DOB:  1946-01-15   MRN:  [id number]  PCP:  No primary provider on file.    Chief Complaint: Dizziness, fasting lab. and Medication Refill   History of Present Illness:  Barbara Grant is a 67 y.o. very pleasant female patient who presents with the following:  Here today to follow- up DM and HTN, seeking a new PCP.  She is still taking her medications, but does need a refill at this time.   She has had DM for an undetermined time, but has been on medication for about one year.   She is fasting today.  She states she got a pneumovax last year per her recollection She is also on fosamax for osteoporosis.    Patient Active Problem List   Diagnosis Date Noted  . Colon polyps 03/05/2011  . Dyspepsia 03/05/2011  . HTN (hypertension) 03/05/2011  . DM (diabetes mellitus) 03/05/2011    Past Medical History  Diagnosis Date  . GERD (gastroesophageal reflux disease)   . HTN (hypertension)   . Osteoporosis   . Diabetes mellitus   . Arthritis     Past Surgical History  Procedure Laterality Date  . Hernia repair      History  Substance Use Topics  . Smoking status: Never Smoker   . Smokeless tobacco: Never Used  . Alcohol Use: No    Family History  Problem Relation Age of Onset  . Colon cancer Mother   . Hypertension Mother   . Colon cancer Sister   . Diabetes Sister     No Known Allergies  Medication list has been reviewed and updated.  Current Outpatient Prescriptions on File Prior to Visit  Medication Sig Dispense Refill  . alendronate (FOSAMAX) 70 MG tablet Take 70 mg by mouth every 7 (seven) days. Take with a full glass of water on an empty stomach.      . bisoprolol-hydrochlorothiazide (ZIAC) 5-6.25 MG per tablet Take 1 tablet by mouth daily.      . lansoprazole (PREVACID) 30 MG capsule Take 30 mg by mouth daily.      . metFORMIN  (GLUCOPHAGE) 500 MG tablet Take 500 mg by mouth daily with breakfast.       . ciprofloxacin (CIPRO) 500 MG tablet Take 1 tablet (500 mg total) by mouth 2 (two) times daily.  5 tablet  0  . lisinopril (PRINIVIL,ZESTRIL) 5 MG tablet Take 5 mg by mouth daily.      . Multiple Vitamin (MULITIVITAMIN WITH MINERALS) TABS Take 1 tablet by mouth daily.       No current facility-administered medications on file prior to visit.    Review of Systems:  As per HPI- otherwise negative.  Physical Examination: Filed Vitals:   12/14/12 0938  BP: 120/80  Pulse: 65  Temp: 97.8 F (36.6 C)  Resp: 16   Filed Vitals:   12/14/12 0938  Height: 5' 4.5" (1.638 m)  Weight: 160 lb 9.6 oz (72.848 kg)   Body mass index is 27.15 kg/(m^2). Ideal Body Weight: Weight in (lb) to have BMI = 25: 147.6  GEN: WDWN, NAD, Non-toxic, A & O x 3, mild overweight HEENT: Atraumatic, Normocephalic. Neck supple. No masses, No LAD.  Bilateral TM wnl, oropharynx normal.  PEERL,EOMI.   Ears and Nose: No external deformity. CV: RRR, No M/G/R. No JVD. No thrill. No extra heart sounds.  PULM: CTA B, no wheezes, crackles, rhonchi. No retractions. No resp. distress. No accessory muscle use.Marland Kitchen EXTR: No c/c/e NEURO Normal gait.  PSYCH: Normally interactive. Conversant. Not depressed or anxious appearing.  Calm demeanor.  Performed diabetic foot exam- normal bilaterally  Results for orders placed in visit on 12/14/12  POCT CBC      Result Value Range   WBC 6.9  4.6 - 10.2 K/uL   Lymph, poc 3.1  0.6 - 3.4   POC LYMPH PERCENT 44.6  10 - 50 %L   MID (cbc) 0.5  0 - 0.9   POC MID % 7.7  0 - 12 %M   POC Granulocyte 3.3  2 - 6.9   Granulocyte percent 47.7  37 - 80 %G   RBC 4.61  4.04 - 5.48 M/uL   Hemoglobin 13.8  12.2 - 16.2 g/dL   HCT, POC 16.1  09.6 - 47.9 %   MCV 94.3  80 - 97 fL   MCH, POC 29.9  27 - 31.2 pg   MCHC 31.7 (*) 31.8 - 35.4 g/dL   RDW, POC 04.5     Platelet Count, POC 191  142 - 424 K/uL   MPV 9.8  0 - 99.8 fL   POCT GLYCOSYLATED HEMOGLOBIN (HGB A1C)      Result Value Range   Hemoglobin A1C 6.4      Assessment and Plan: Type II or unspecified type diabetes mellitus without mention of complication, not stated as uncontrolled - Plan: POCT CBC, POCT glycosylated hemoglobin (Hb A1C), Comprehensive metabolic panel, Lipid panel, Microalbumin, urine, metFORMIN (GLUCOPHAGE) 500 MG tablet, lisinopril (PRINIVIL,ZESTRIL) 5 MG tablet  HTN (hypertension) - Plan: lisinopril (PRINIVIL,ZESTRIL) 5 MG tablet, bisoprolol-hydrochlorothiazide (ZIAC) 5-6.25 MG per tablet  GERD (gastroesophageal reflux disease) - Plan: lansoprazole (PREVACID) 30 MG capsule  Osteoporosis, unspecified - Plan: alendronate (FOSAMAX) 70 MG tablet  Await the rest of her labs- will need to ensure her creatinine clearance is sufficient for her to take metformin, and determine if she needs a statin   Signed Abbe Amsterdam, MD  12/18/12- cr clearance = 71.8.  Ok to continue metformin Called- VM belongs to someone named [place].  ?if same pt.  Will send a letter

## 2012-12-15 LAB — LIPID PANEL
Cholesterol: 195 mg/dL (ref 0–200)
HDL: 49 mg/dL (ref >39)
Total CHOL/HDL Ratio: 4 Ratio
Triglycerides: 122 mg/dL (ref <150)

## 2012-12-15 LAB — COMPREHENSIVE METABOLIC PANEL
Albumin: 4.6 g/dL (ref 3.5–5.2)
BUN: 14 mg/dL (ref 6–23)
CO2: 26 mEq/L (ref 19–32)
Calcium: 9.7 mg/dL (ref 8.4–10.5)
Chloride: 102 mEq/L (ref 96–112)
Creat: 0.8 mg/dL (ref 0.50–1.10)
Glucose, Bld: 105 mg/dL — ABNORMAL HIGH (ref 70–99)
Potassium: 4.1 mEq/L (ref 3.5–5.3)

## 2012-12-15 LAB — MICROALBUMIN, URINE: Microalb, Ur: 0.5 mg/dL (ref 0.00–1.89)

## 2012-12-18 ENCOUNTER — Encounter: Payer: Self-pay | Admitting: Family Medicine

## 2012-12-20 ENCOUNTER — Other Ambulatory Visit: Payer: Self-pay | Admitting: Radiology

## 2012-12-20 MED ORDER — LANCETS MISC: Status: AC

## 2012-12-20 MED ORDER — GLUCOSE BLOOD VI STRP: ORAL_STRIP | Status: AC

## 2012-12-20 NOTE — Telephone Encounter (Signed)
Printed to fax  

## 2013-06-18 ENCOUNTER — Other Ambulatory Visit: Payer: Self-pay | Admitting: Family Medicine

## 2013-07-16 ENCOUNTER — Other Ambulatory Visit: Payer: Self-pay | Admitting: Physician Assistant

## 2014-01-09 ENCOUNTER — Other Ambulatory Visit: Payer: Self-pay | Admitting: Physician Assistant

## 2016-01-01 ENCOUNTER — Encounter: Payer: Self-pay | Admitting: *Deleted

## 2016-01-28 ENCOUNTER — Encounter: Payer: Self-pay | Admitting: Internal Medicine

## 2017-04-30 ENCOUNTER — Encounter (HOSPITAL_COMMUNITY): Payer: Self-pay

## 2017-04-30 ENCOUNTER — Emergency Department (HOSPITAL_COMMUNITY): Payer: No Typology Code available for payment source

## 2017-04-30 ENCOUNTER — Emergency Department (HOSPITAL_COMMUNITY)
Admission: EM | Admit: 2017-04-30 | Discharge: 2017-04-30 | Disposition: A | Discharge status: Home / Self Care | Payer: No Typology Code available for payment source | Attending: Emergency Medicine | Admitting: Emergency Medicine

## 2017-04-30 DIAGNOSIS — M542 Cervicalgia: Secondary | ICD-10-CM | POA: Diagnosis not present

## 2017-04-30 DIAGNOSIS — E119 Type 2 diabetes mellitus without complications: Secondary | ICD-10-CM | POA: Diagnosis not present

## 2017-04-30 DIAGNOSIS — I10 Essential (primary) hypertension: Secondary | ICD-10-CM | POA: Diagnosis not present

## 2017-04-30 DIAGNOSIS — Y92410 Unspecified street and highway as the place of occurrence of the external cause: Secondary | ICD-10-CM | POA: Insufficient documentation

## 2017-04-30 DIAGNOSIS — Z23 Encounter for immunization: Secondary | ICD-10-CM | POA: Diagnosis not present

## 2017-04-30 DIAGNOSIS — Y999 Unspecified external cause status: Secondary | ICD-10-CM | POA: Diagnosis not present

## 2017-04-30 DIAGNOSIS — M25512 Pain in left shoulder: Secondary | ICD-10-CM

## 2017-04-30 DIAGNOSIS — T148XXA Other injury of unspecified body region, initial encounter: Secondary | ICD-10-CM

## 2017-04-30 DIAGNOSIS — Y9389 Activity, other specified: Secondary | ICD-10-CM | POA: Diagnosis not present

## 2017-04-30 DIAGNOSIS — Z7984 Long term (current) use of oral hypoglycemic drugs: Secondary | ICD-10-CM | POA: Diagnosis not present

## 2017-04-30 DIAGNOSIS — Z79899 Other long term (current) drug therapy: Secondary | ICD-10-CM | POA: Diagnosis not present

## 2017-04-30 DIAGNOSIS — R51 Headache: Secondary | ICD-10-CM | POA: Diagnosis not present

## 2017-04-30 HISTORY — DX: Essential (primary) hypertension: I10

## 2017-04-30 HISTORY — DX: Type 2 diabetes mellitus without complications: E11.9

## 2017-04-30 LAB — COMPREHENSIVE METABOLIC PANEL
ALT: 22 U/L (ref 14–54)
AST: 44 U/L — AB (ref 15–41)
Albumin: 3.6 g/dL (ref 3.5–5.0)
Alkaline Phosphatase: 131 U/L — ABNORMAL HIGH (ref 38–126)
Anion gap: 9 (ref 5–15)
BUN: 15 mg/dL (ref 6–20)
CHLORIDE: 100 mmol/L — AB (ref 101–111)
CO2: 25 mmol/L (ref 22–32)
CREATININE: 0.85 mg/dL (ref 0.44–1.00)
Calcium: 9.1 mg/dL (ref 8.9–10.3)
GFR calc Af Amer: >60 mL/min (ref >60)
GLUCOSE: 238 mg/dL — AB (ref 65–99)
Potassium: 5 mmol/L (ref 3.5–5.1)
Sodium: 134 mmol/L — ABNORMAL LOW (ref 135–145)
Total Bilirubin: 1 mg/dL (ref 0.3–1.2)
Total Protein: 7 g/dL (ref 6.5–8.1)

## 2017-04-30 LAB — I-STAT CG4 LACTIC ACID, ED: Lactic Acid, Venous: 3.17 mmol/L (ref 0.5–1.9)

## 2017-04-30 LAB — CBC
HCT: 40.6 % (ref 36.0–46.0)
Hemoglobin: 13.4 g/dL (ref 12.0–15.0)
MCH: 30 pg (ref 26.0–34.0)
MCHC: 33 g/dL (ref 30.0–36.0)
MCV: 90.8 fL (ref 78.0–100.0)
PLATELETS: 189 10*3/uL (ref 150–400)
RBC: 4.47 MIL/uL (ref 3.87–5.11)
RDW: 14.6 % (ref 11.5–15.5)
WBC: 8.7 10*3/uL (ref 4.0–10.5)

## 2017-04-30 LAB — I-STAT CHEM 8, ED
BUN: 19 mg/dL (ref 6–20)
CREATININE: 0.8 mg/dL (ref 0.44–1.00)
Calcium, Ion: 1.2 mmol/L (ref 1.15–1.40)
Chloride: 99 mmol/L — ABNORMAL LOW (ref 101–111)
Glucose, Bld: 244 mg/dL — ABNORMAL HIGH (ref 65–99)
HEMATOCRIT: 42 % (ref 36.0–46.0)
HEMOGLOBIN: 14.3 g/dL (ref 12.0–15.0)
POTASSIUM: 4.2 mmol/L (ref 3.5–5.1)
Sodium: 138 mmol/L (ref 135–145)
TCO2: 27 mmol/L (ref 22–32)

## 2017-04-30 LAB — PROTIME-INR
INR: 0.98
Prothrombin Time: 12.9 seconds (ref 11.4–15.2)

## 2017-04-30 LAB — SAMPLE TO BLOOD BANK

## 2017-04-30 LAB — CDS SEROLOGY

## 2017-04-30 LAB — ETHANOL: Alcohol, Ethyl (B): <10 mg/dL (ref <10)

## 2017-04-30 MED ORDER — CYCLOBENZAPRINE HCL 10 MG PO TABS: 10.0000 mg | ORAL_TABLET | Freq: Two times a day (BID) | ORAL | 0 refills | Status: AC | PRN

## 2017-04-30 MED ORDER — TETANUS-DIPHTH-ACELL PERTUSSIS 5-2.5-18.5 LF-MCG/0.5 IM SUSP
0.5000 mL | Freq: Once | INTRAMUSCULAR | Status: AC
  Administered 2017-04-30: 0.5 mL via INTRAMUSCULAR
  Filled 2017-04-30: qty 0.5

## 2017-04-30 MED ORDER — FENTANYL CITRATE (PF) 100 MCG/2ML IJ SOLN
50.0000 ug | Freq: Once | INTRAMUSCULAR | Status: AC
  Administered 2017-04-30: 50 ug via INTRAVENOUS
  Filled 2017-04-30: qty 2

## 2017-04-30 NOTE — ED Notes (Signed)
Pt stable, ambulatory, states understanding of discharge instructions 

## 2017-04-30 NOTE — Progress Notes (Signed)
   04/30/17 1800  Clinical Encounter Type  Visited With Patient  Visit Type Trauma  Referral From Nurse  Consult/Referral To Chaplain  Spiritual Encounters  Spiritual Needs Emotional  Stress Factors  Patient Stress Factors Health changes  Chaplain visited with PT, team used language services to communicate with PT.  Attempting to get next of  Kin info

## 2017-04-30 NOTE — ED Notes (Signed)
Patient transported to CT 

## 2017-04-30 NOTE — ED Notes (Signed)
Pt ambulated in hallway. Steady gait. RN notified.
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
## 2017-05-01 ENCOUNTER — Encounter: Payer: Self-pay | Admitting: *Deleted
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
## 2017-07-27 ENCOUNTER — Emergency Department (HOSPITAL_COMMUNITY): Payer: Medicare Other

## 2017-07-27 ENCOUNTER — Encounter (HOSPITAL_COMMUNITY): Payer: Self-pay

## 2017-07-27 ENCOUNTER — Emergency Department (HOSPITAL_COMMUNITY)
Admission: EM | Admit: 2017-07-27 | Discharge: 2017-07-28 | Disposition: A | Discharge status: Home / Self Care | Payer: Medicare Other | Attending: Emergency Medicine | Admitting: Emergency Medicine

## 2017-07-27 ENCOUNTER — Other Ambulatory Visit: Payer: Self-pay

## 2017-07-27 DIAGNOSIS — Z7984 Long term (current) use of oral hypoglycemic drugs: Secondary | ICD-10-CM | POA: Insufficient documentation

## 2017-07-27 DIAGNOSIS — Z79899 Other long term (current) drug therapy: Secondary | ICD-10-CM | POA: Insufficient documentation

## 2017-07-27 DIAGNOSIS — Z8719 Personal history of other diseases of the digestive system: Secondary | ICD-10-CM | POA: Diagnosis not present

## 2017-07-27 DIAGNOSIS — R42 Dizziness and giddiness: Secondary | ICD-10-CM

## 2017-07-27 DIAGNOSIS — I10 Essential (primary) hypertension: Secondary | ICD-10-CM | POA: Insufficient documentation

## 2017-07-27 DIAGNOSIS — E119 Type 2 diabetes mellitus without complications: Secondary | ICD-10-CM | POA: Insufficient documentation

## 2017-07-27 DIAGNOSIS — I672 Cerebral atherosclerosis: Secondary | ICD-10-CM | POA: Diagnosis not present

## 2017-07-27 LAB — DIFFERENTIAL
BASOS ABS: 0 10*3/uL (ref 0.0–0.1)
BASOS PCT: 0 %
Eosinophils Absolute: 0 10*3/uL (ref 0.0–0.7)
Eosinophils Relative: 0 %
Lymphocytes Relative: 47 %
Lymphs Abs: 3.4 10*3/uL (ref 0.7–4.0)
Monocytes Absolute: 0.5 10*3/uL (ref 0.1–1.0)
Monocytes Relative: 7 %
NEUTROS ABS: 3.3 10*3/uL (ref 1.7–7.7)
NEUTROS PCT: 46 %

## 2017-07-27 LAB — CBC
HEMATOCRIT: 37.8 % (ref 36.0–46.0)
Hemoglobin: 12.2 g/dL (ref 12.0–15.0)
MCH: 29.1 pg (ref 26.0–34.0)
MCHC: 32.3 g/dL (ref 30.0–36.0)
MCV: 90.2 fL (ref 78.0–100.0)
PLATELETS: 224 10*3/uL (ref 150–400)
RBC: 4.19 MIL/uL (ref 3.87–5.11)
RDW: 14.6 % (ref 11.5–15.5)
WBC: 7.3 10*3/uL (ref 4.0–10.5)

## 2017-07-27 LAB — COMPREHENSIVE METABOLIC PANEL
ALBUMIN: 3.7 g/dL (ref 3.5–5.0)
ALT: 26 U/L (ref 14–54)
AST: 21 U/L (ref 15–41)
Alkaline Phosphatase: 138 U/L — ABNORMAL HIGH (ref 38–126)
Anion gap: 9 (ref 5–15)
BUN: 21 mg/dL — AB (ref 6–20)
CHLORIDE: 105 mmol/L (ref 101–111)
CO2: 27 mmol/L (ref 22–32)
CREATININE: 0.82 mg/dL (ref 0.44–1.00)
Calcium: 9.3 mg/dL (ref 8.9–10.3)
GFR calc Af Amer: >60 mL/min (ref >60)
GFR calc non Af Amer: >60 mL/min (ref >60)
Glucose, Bld: 150 mg/dL — ABNORMAL HIGH (ref 65–99)
Potassium: 4 mmol/L (ref 3.5–5.1)
Sodium: 141 mmol/L (ref 135–145)
Total Bilirubin: 0.8 mg/dL (ref 0.3–1.2)
Total Protein: 7.5 g/dL (ref 6.5–8.1)

## 2017-07-27 LAB — I-STAT CHEM 8, ED
BUN: 24 mg/dL — ABNORMAL HIGH (ref 6–20)
Calcium, Ion: 1.2 mmol/L (ref 1.15–1.40)
Chloride: 102 mmol/L (ref 101–111)
Creatinine, Ser: 0.8 mg/dL (ref 0.44–1.00)
GLUCOSE: 142 mg/dL — AB (ref 65–99)
HEMATOCRIT: 39 % (ref 36.0–46.0)
Hemoglobin: 13.3 g/dL (ref 12.0–15.0)
POTASSIUM: 4 mmol/L (ref 3.5–5.1)
Sodium: 142 mmol/L (ref 135–145)
TCO2: 28 mmol/L (ref 22–32)

## 2017-07-27 LAB — APTT: APTT: 31 s (ref 24–36)

## 2017-07-27 LAB — PROTIME-INR
INR: 1
PROTHROMBIN TIME: 13.1 s (ref 11.4–15.2)

## 2017-07-27 LAB — I-STAT TROPONIN, ED: Troponin i, poc: 0 ng/mL (ref 0.00–0.08)

## 2017-07-27 MED ORDER — MECLIZINE HCL 25 MG PO TABS
25.0000 mg | ORAL_TABLET | Freq: Once | ORAL | Status: AC
  Administered 2017-07-27: 25 mg via ORAL
  Filled 2017-07-27: qty 1

## 2017-07-27 NOTE — ED Notes (Signed)
Results reviewed. Patient at desk asking about wait time.

## 2017-07-27 NOTE — ED Notes (Signed)
Patient ambulated to room unassisted - refused wheelchair.

## 2017-07-27 NOTE — ED Triage Notes (Signed)
Pt presents to the ed with complaints of dizziness x 2 weeks.

## 2017-07-27 NOTE — ED Notes (Signed)
Orthostatics  Lying: 140/69 HR71 Sitting:  143/79 HR74  (states "little bit dizzy") Standing: [phone] HR77  (states feels more dizzy)

## 2017-07-28 MED ORDER — MECLIZINE HCL 25 MG PO TABS: 25.0000 mg | ORAL_TABLET | Freq: Three times a day (TID) | ORAL | 0 refills | Status: DC | PRN
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
## 2017-09-01 ENCOUNTER — Emergency Department (HOSPITAL_COMMUNITY)
Admission: EM | Admit: 2017-09-01 | Discharge: 2017-09-01 | Disposition: A | Discharge status: Home / Self Care | Payer: Medicare Other | Attending: Emergency Medicine | Admitting: Emergency Medicine

## 2017-09-01 ENCOUNTER — Encounter (HOSPITAL_COMMUNITY): Payer: Self-pay | Admitting: Emergency Medicine

## 2017-09-01 DIAGNOSIS — Z7984 Long term (current) use of oral hypoglycemic drugs: Secondary | ICD-10-CM | POA: Diagnosis not present

## 2017-09-01 DIAGNOSIS — I10 Essential (primary) hypertension: Secondary | ICD-10-CM | POA: Insufficient documentation

## 2017-09-01 DIAGNOSIS — R208 Other disturbances of skin sensation: Secondary | ICD-10-CM | POA: Insufficient documentation

## 2017-09-01 DIAGNOSIS — E119 Type 2 diabetes mellitus without complications: Secondary | ICD-10-CM | POA: Insufficient documentation

## 2017-09-01 DIAGNOSIS — T7529XA Other effects of vibration, initial encounter: Secondary | ICD-10-CM

## 2017-09-01 DIAGNOSIS — Z79899 Other long term (current) drug therapy: Secondary | ICD-10-CM | POA: Insufficient documentation

## 2017-09-01 LAB — COMPREHENSIVE METABOLIC PANEL
ALBUMIN: 3.8 g/dL (ref 3.5–5.0)
ALT: 20 U/L (ref 14–54)
AST: 22 U/L (ref 15–41)
Alkaline Phosphatase: 123 U/L (ref 38–126)
Anion gap: 9 (ref 5–15)
BUN: 18 mg/dL (ref 6–20)
CHLORIDE: 106 mmol/L (ref 101–111)
CO2: 24 mmol/L (ref 22–32)
CREATININE: 1.16 mg/dL — AB (ref 0.44–1.00)
Calcium: 9.1 mg/dL (ref 8.9–10.3)
GFR calc Af Amer: 53 mL/min — ABNORMAL LOW (ref >60)
GFR calc non Af Amer: 46 mL/min — ABNORMAL LOW (ref >60)
GLUCOSE: 182 mg/dL — AB (ref 65–99)
Potassium: 3.8 mmol/L (ref 3.5–5.1)
Sodium: 139 mmol/L (ref 135–145)
Total Bilirubin: 0.5 mg/dL (ref 0.3–1.2)
Total Protein: 7.3 g/dL (ref 6.5–8.1)

## 2017-09-01 LAB — CBC WITH DIFFERENTIAL/PLATELET
BASOS ABS: 0 10*3/uL (ref 0.0–0.1)
BASOS PCT: 0 %
Eosinophils Absolute: 0 10*3/uL (ref 0.0–0.7)
Eosinophils Relative: 1 %
HEMATOCRIT: 37.1 % (ref 36.0–46.0)
Hemoglobin: 12.4 g/dL (ref 12.0–15.0)
Lymphocytes Relative: 44 %
Lymphs Abs: 3.1 10*3/uL (ref 0.7–4.0)
MCH: 30 pg (ref 26.0–34.0)
MCHC: 33.4 g/dL (ref 30.0–36.0)
MCV: 89.6 fL (ref 78.0–100.0)
MONO ABS: 0.4 10*3/uL (ref 0.1–1.0)
Monocytes Relative: 6 %
Neutro Abs: 3.4 10*3/uL (ref 1.7–7.7)
Neutrophils Relative %: 49 %
PLATELETS: 186 10*3/uL (ref 150–400)
RBC: 4.14 MIL/uL (ref 3.87–5.11)
RDW: 14.5 % (ref 11.5–15.5)
WBC: 6.9 10*3/uL (ref 4.0–10.5)

## 2017-09-01 MED ORDER — GABAPENTIN 100 MG PO CAPS: 100.0000 mg | ORAL_CAPSULE | Freq: Three times a day (TID) | ORAL | 0 refills | Status: AC

## 2017-09-01 NOTE — ED Triage Notes (Addendum)
Per interpreter, patient c/o "vibrating sensation from knees to shoulders" x3 weeks. Patient denies recent injury, urinary sx, N/V/D, weakness, headache.

## 2017-09-01 NOTE — ED Provider Notes (Signed)
[facility]-EMERGENCY DEPT Provider Note   CSN: [id number] Arrival date & time: 09/01/17  1454     History   Chief Complaint Chief Complaint  Patient presents with  . Spasms    HPI Barbara Grant is a 72 y.o. female.  HPI   Presents with 3 weeks of sensation in body, like tingling, shaking sensation from knees up through shoulders, started quickly.  More like a vibration, starts in the knees and radiates to the shoulders, it comes and goes, gets it every half an hour, notices it when resting, nothing seems to make it better or worse, knees up through shoulders through body bilaterally.  Goes from there to hands bilaterally. Feels like holding a vibrating cell phone.   No chest pain, no difficulty breathing, no fever No medication changes Glucometer is not working well, and is getting different sugars  Spanish speaking, used interpreter  Past Medical History:  Diagnosis Date  . Arthritis   . Diabetes mellitus   . Diabetes mellitus without complication (HCC)   . GERD (gastroesophageal reflux disease)   . HTN (hypertension)   . Hypertension   . Osteoporosis     Patient Active Problem List   Diagnosis Date Noted  . Colon polyps 03/05/2011  . Dyspepsia 03/05/2011  . HTN (hypertension) 03/05/2011  . DM (diabetes mellitus) (HCC) 03/05/2011    Past Surgical History:  Procedure Laterality Date  . HERNIA REPAIR       OB History   None      Home Medications    Prior to Admission medications   Medication Sig Start Date End Date Taking? Authorizing Provider  atorvastatin (LIPITOR) 40 MG tablet Take 40 mg by mouth daily. 04/24/17  Yes [provider]  furosemide (LASIX) 20 MG tablet Take 20 mg by mouth daily. 04/29/17  Yes [provider]  glipiZIDE (GLUCOTROL) 10 MG tablet Take 10 mg by mouth 2 (two) times daily before a meal.  03/02/17  Yes [provider]  losartan (COZAAR) 100 MG tablet Take 100 mg by mouth daily.  03/02/17  Yes [provider]  meloxicam (MOBIC) 7.5 MG tablet Take 7.5 mg by mouth daily as needed for pain.   Yes [provider]  pantoprazole (PROTONIX) 40 MG tablet Take 40 mg by mouth daily. 04/19/17  Yes [provider]  alendronate (FOSAMAX) 70 MG tablet Take 1 tablet (70 mg total) by mouth every 7 (seven) days. Take with a full glass of water on an empty stomach. 12/14/12   Copland, Gwenlyn Found, MD  bisoprolol-hydrochlorothiazide (ZIAC) 5-6.25 MG per tablet Take 1 tablet by mouth daily. Patient not taking: Reported on 07/27/2017 12/14/12   Copland, Gwenlyn Found, MD  cyclobenzaprine (FLEXERIL) 10 MG tablet Take 1 tablet (10 mg total) by mouth 2 (two) times daily as needed for muscle spasms. Patient not taking: Reported on 07/27/2017 04/30/17   Alvira Monday, MD  gabapentin (NEURONTIN) 100 MG capsule Take 1 capsule (100 mg total) by mouth 3 (three) times daily. 09/01/17 10/01/17  Alvira Monday, MD  glucose blood (CHOICE DM FORA G20 TEST STRIPS) test strip Use as instructed 12/20/12   Nelva Nay, PA-C  Lancets MISC To check blood sugar tid dx code 250.00 accu check meter 12/20/12   Rhoderick Moody M, PA-C  lansoprazole (PREVACID) 30 MG capsule Take 1 capsule (30 mg total) by mouth daily. Patient not taking: Reported on 07/27/2017 12/14/12   Copland, Gwenlyn Found, MD  lisinopril (PRINIVIL,ZESTRIL) 5 MG tablet Take  1 tablet (5 mg total) by mouth daily. Patient not taking: Reported on 07/27/2017 12/14/12   Copland, Gwenlyn Found, MD  meclizine (ANTIVERT) 25 MG tablet Take 1 tablet (25 mg total) by mouth 3 (three) times daily as needed for dizziness. 07/28/17   Horton, Mayer Masker, MD  metFORMIN (GLUCOPHAGE) 500 MG tablet TAKE 1 TABLET BY MOUTH DAILY,** NEEDS OFFICE VISIT FOR ADDITIONAL REFILLS** Patient not taking: Reported on 07/27/2017    Morrell Riddle, PA-C    Family History Family History  Problem Relation Age of Onset  . Colon cancer Mother   . Hypertension Mother   . Colon  cancer Sister   . Diabetes Sister     Social History Social History   Tobacco Use  . Smoking status: Never Smoker  . Smokeless tobacco: Never Used  Substance Use Topics  . Alcohol use: No    Frequency: Never  . Drug use: No     Allergies   Patient has no known allergies.   Review of Systems Review of Systems  Constitutional: Negative for fever.  HENT: Negative for sore throat.   Eyes: Negative for visual disturbance.  Respiratory: Negative for cough and shortness of breath.   Cardiovascular: Negative for chest pain.  Gastrointestinal: Negative for abdominal pain, nausea and vomiting.  Genitourinary: Negative for difficulty urinating.  Musculoskeletal: Negative for back pain and neck pain.  Skin: Negative for rash.  Neurological: Negative for dizziness, syncope, facial asymmetry, speech difficulty, weakness, numbness and headaches.     Physical Exam Updated Vital Signs BP 140/85   Pulse 81   Temp 98.1 F (36.7 C) (Oral)   Resp 20   Ht 5\' 5"  (1.651 m)   Wt 76.3 kg (168 lb 3.2 oz)   SpO2 99%   BMI 27.99 kg/m   Physical Exam  Constitutional: She is oriented to person, place, and time. She appears well-developed and well-nourished. No distress.  HENT:  Head: Normocephalic and atraumatic.  Eyes: Conjunctivae and EOM are normal.  Neck: Normal range of motion.  Cardiovascular: Normal rate, regular rhythm, normal heart sounds and intact distal pulses. Exam reveals no gallop and no friction rub.  No murmur heard. Pulmonary/Chest: Effort normal and breath sounds normal. No respiratory distress. She has no wheezes. She has no rales.  Abdominal: Soft. She exhibits no distension. There is no tenderness. There is no guarding.  Musculoskeletal: She exhibits no edema or tenderness.  Neurological: She is alert and oriented to person, place, and time. She has normal strength. She displays no tremor. No cranial nerve deficit or sensory deficit. Coordination and gait normal.  GCS eye subscore is 4. GCS verbal subscore is 5. GCS motor subscore is 6.  Skin: Skin is warm and dry. No rash noted. She is not diaphoretic. No erythema.  Nursing note and vitals reviewed.    ED Treatments / Results  Labs (all labs ordered are listed, but only abnormal results are displayed) Labs Reviewed  COMPREHENSIVE METABOLIC PANEL - Abnormal; Notable for the following components:      Result Value   Glucose, Bld 182 (*)    Creatinine, Ser 1.16 (*)    GFR calc non Af Amer 46 (*)    GFR calc Af Amer 53 (*)    All other components within normal limits  CBC WITH DIFFERENTIAL/PLATELET    EKG EKG Interpretation  Date/Time:  Tuesday September 01 2017 17:09:44 EDT Ventricular Rate:  80 PR Interval:    QRS Duration: 96 QT Interval:  404  QTC Calculation: 466 R Axis:   -34 Text Interpretation:  Sinus rhythm Left axis deviation Abnormal R-wave progression, early transition Baseline wander in lead(s) V5 No significant change since last tracing Confirmed by Alvira MondaySchlossman, Genasis Zingale ([id number]) on 09/01/2017 5:18:46 PM Also confirmed by Alvira MondaySchlossman, Katheryne Gorr ([id number]), editor Sheppard EvensSimpson, Miranda [phone]  on 09/02/2017 7:19:09 AM   Radiology No results found.  Procedures Procedures (including critical care time)  Medications Ordered in ED Medications - No data to display   Initial Impression / Assessment and Plan / ED Course  I have reviewed the triage vital signs and the nursing notes.  Pertinent labs & imaging results that were available during my care of the patient were reviewed by me and considered in my medical decision making (see chart for details).     72yo female with history of DM, htn presents with concern for sensation of vibration from her knees through to her shoulders and hands.  Bilateral and atypical symptoms.  No significant electrolyte abnormalities to explain symptoms. No back pain to suggest emergent spinal pathology.  Normal neurologic exam and history not consistent with CVA. No  medication changes. Unclear etiology of this sensation at this time.  Possible neuropathy involving the hands.  Will start gabapentin low dose and recommend Neurology follow up.  Cr mildly elevated from prior, recommended hydration and recheck with PCP. Patient discharged in stable condition with understanding of reasons to return.     Final Clinical Impressions(s) / ED Diagnoses   Final diagnoses:  Other effects of vibration, initial encounter    ED Discharge Orders        Ordered    gabapentin (NEURONTIN) 100 MG capsule  3 times daily     09/01/17 1840       Alvira MondaySchlossman, Lyle Leisner, MD 09/02/17 1313

## 2017-11-05 ENCOUNTER — Ambulatory Visit: Payer: Medicare Other | Admitting: Neurology

## 2017-12-17 ENCOUNTER — Ambulatory Visit: Payer: Medicare Other | Admitting: Neurology
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
## 2019-06-11 ENCOUNTER — Other Ambulatory Visit: Payer: Self-pay

## 2019-06-11 ENCOUNTER — Emergency Department
Admission: EM | Admit: 2019-06-11 | Discharge: 2019-06-11 | Disposition: A | Discharge status: Home / Self Care | Payer: Medicare Other | Attending: Emergency Medicine | Admitting: Emergency Medicine

## 2019-06-11 DIAGNOSIS — Z79899 Other long term (current) drug therapy: Secondary | ICD-10-CM | POA: Diagnosis not present

## 2019-06-11 DIAGNOSIS — I10 Essential (primary) hypertension: Secondary | ICD-10-CM | POA: Diagnosis not present

## 2019-06-11 DIAGNOSIS — Z7984 Long term (current) use of oral hypoglycemic drugs: Secondary | ICD-10-CM | POA: Diagnosis not present

## 2019-06-11 DIAGNOSIS — E1165 Type 2 diabetes mellitus with hyperglycemia: Secondary | ICD-10-CM

## 2019-06-11 LAB — COMPREHENSIVE METABOLIC PANEL
ALT: 20 U/L (ref 0–44)
AST: 21 U/L (ref 15–41)
Albumin: 4.1 g/dL (ref 3.5–5.0)
Alkaline Phosphatase: 201 U/L — ABNORMAL HIGH (ref 38–126)
Anion gap: 9 (ref 5–15)
BUN: 16 mg/dL (ref 8–23)
CO2: 28 mmol/L (ref 22–32)
Calcium: 9.3 mg/dL (ref 8.9–10.3)
Chloride: 99 mmol/L (ref 98–111)
Creatinine, Ser: 0.76 mg/dL (ref 0.44–1.00)
GFR calc Af Amer: >60 mL/min (ref >60)
GFR calc non Af Amer: >60 mL/min (ref >60)
Glucose, Bld: 266 mg/dL — ABNORMAL HIGH (ref 70–99)
Potassium: 3.6 mmol/L (ref 3.5–5.1)
Sodium: 136 mmol/L (ref 135–145)
Total Bilirubin: 0.7 mg/dL (ref 0.3–1.2)
Total Protein: 8.1 g/dL (ref 6.5–8.1)

## 2019-06-11 LAB — CBC
HCT: 40.8 % (ref 36.0–46.0)
Hemoglobin: 13.6 g/dL (ref 12.0–15.0)
MCH: 30.7 pg (ref 26.0–34.0)
MCHC: 33.3 g/dL (ref 30.0–36.0)
MCV: 92.1 fL (ref 80.0–100.0)
Platelets: 211 10*3/uL (ref 150–400)
RBC: 4.43 MIL/uL (ref 3.87–5.11)
RDW: 13.3 % (ref 11.5–15.5)
WBC: 7.6 10*3/uL (ref 4.0–10.5)
nRBC: 0 % (ref 0.0–0.2)

## 2019-06-11 LAB — URINALYSIS, COMPLETE (UACMP) WITH MICROSCOPIC
Bacteria, UA: NONE SEEN
Bilirubin Urine: NEGATIVE
Glucose, UA: >=500 mg/dL — AB
Ketones, ur: NEGATIVE mg/dL
Leukocytes,Ua: NEGATIVE
Nitrite: NEGATIVE
Protein, ur: NEGATIVE mg/dL
Specific Gravity, Urine: 1.021 (ref 1.005–1.030)
pH: 5 (ref 5.0–8.0)

## 2019-06-11 LAB — GLUCOSE, CAPILLARY: Glucose-Capillary: 260 mg/dL — ABNORMAL HIGH (ref 70–99)

## 2019-06-11 MED ORDER — ONDANSETRON 4 MG PO TBDP: 4.0000 mg | ORAL_TABLET | Freq: Three times a day (TID) | ORAL | 0 refills | Status: DC | PRN

## 2019-06-11 MED ORDER — METFORMIN HCL 500 MG PO TABS: 500.0000 mg | ORAL_TABLET | Freq: Every day | ORAL | 0 refills | Status: AC

## 2019-06-11 NOTE — ED Triage Notes (Addendum)
Pt reports that she has hyperglycemia todays reading 398 - pt took glipizide 20mg  - pt denies any pain or other reason for being in the ED Pt went to ED in [facility] yesterday and was told to take glipizide 20mg  BID and pt states this is not working - she states her PCP told her to go to ED yesterday - pt is back today because the increase yesterday has not worked

## 2019-06-11 NOTE — ED Notes (Signed)
Pt denies pain and states that she doesn't feel any different that her normal. Denies n/v/d, fevers, or other sypmtoms.

## 2019-06-11 NOTE — ED Notes (Signed)
Discharge paper signed and sent to records.

## 2019-06-11 NOTE — ED Provider Notes (Signed)
Barbara Grant  ____________________________________________  Time seen: Approximately 8:14 PM  I have reviewed the triage vital signs and the nursing notes.   HISTORY  Chief Complaint Hyperglycemia    HPI Barbara Grant is a 74 y.o. female with a history of diabetes, GERD, hypertension who comes the ED complaining of hyperglycemia.  She takes glipizide twice daily but does not take Metformin because she said it made her feel sick.  Does not take insulin.  She denies any acute symptoms, no recent fevers chills cough shortness of breath chest pain body aches malaise or fatigue.  She was concerned because her blood sugar was almost 400 at home.  Here it is 260.      Past Medical History:  Diagnosis Date  . Arthritis   . Diabetes mellitus   . Diabetes mellitus without complication (HCC)   . GERD (gastroesophageal reflux disease)   . HTN (hypertension)   . Hypertension   . Osteoporosis      Patient Active Problem List   Diagnosis Date Noted  . Colon polyps 03/05/2011  . Dyspepsia 03/05/2011  . HTN (hypertension) 03/05/2011  . DM (diabetes mellitus) (HCC) 03/05/2011     Past Surgical History:  Procedure Laterality Date  . HERNIA REPAIR       Prior to Admission medications   Medication Sig Start Date End Date Taking? Authorizing Provider  alendronate (FOSAMAX) 70 MG tablet Take 1 tablet (70 mg total) by mouth every 7 (seven) days. Take with a full glass of water on an empty stomach. 12/14/12   Copland, Gwenlyn Found, MD  atorvastatin (LIPITOR) 40 MG tablet Take 40 mg by mouth daily. 04/24/17   [provider]  bisoprolol-hydrochlorothiazide (ZIAC) 5-6.25 MG per tablet Take 1 tablet by mouth daily. Patient not taking: Reported on 07/27/2017 12/14/12   Copland, Gwenlyn Found, MD  cyclobenzaprine (FLEXERIL) 10 MG tablet Take 1 tablet (10 mg total) by mouth 2 (two) times daily as needed for muscle spasms. Patient not  taking: Reported on 07/27/2017 04/30/17   Alvira Monday, MD  furosemide (LASIX) 20 MG tablet Take 20 mg by mouth daily. 04/29/17   [provider]  gabapentin (NEURONTIN) 100 MG capsule Take 1 capsule (100 mg total) by mouth 3 (three) times daily. 09/01/17 10/01/17  Alvira Monday, MD  glipiZIDE (GLUCOTROL) 10 MG tablet Take 10 mg by mouth 2 (two) times daily before a meal.  03/02/17   [provider]  glucose blood (CHOICE DM FORA G20 TEST STRIPS) test strip Use as instructed 12/20/12   Nelva Nay, PA-C  Lancets MISC To check blood sugar tid dx code 250.00 accu check meter 12/20/12   Rhoderick Moody M, PA-C  lansoprazole (PREVACID) 30 MG capsule Take 1 capsule (30 mg total) by mouth daily. Patient not taking: Reported on 07/27/2017 12/14/12   Copland, Gwenlyn Found, MD  lisinopril (PRINIVIL,ZESTRIL) 5 MG tablet Take 1 tablet (5 mg total) by mouth daily. Patient not taking: Reported on 07/27/2017 12/14/12   Copland, Gwenlyn Found, MD  losartan (COZAAR) 100 MG tablet Take 100 mg by mouth daily. 03/02/17   [provider]  meclizine (ANTIVERT) 25 MG tablet Take 1 tablet (25 mg total) by mouth 3 (three) times daily as needed for dizziness. 07/28/17   Horton, Mayer Masker, MD  meloxicam (MOBIC) 7.5 MG tablet Take 7.5 mg by mouth daily as needed for pain.    [provider]  metFORMIN (GLUCOPHAGE) 500 MG tablet Take 1 tablet (  500 mg total) by mouth daily with breakfast. 06/11/19   Carrie Mew, MD  ondansetron (ZOFRAN ODT) 4 MG disintegrating tablet Take 1 tablet (4 mg total) by mouth every 8 (eight) hours as needed for nausea or vomiting. 06/11/19   Carrie Mew, MD  pantoprazole (PROTONIX) 40 MG tablet Take 40 mg by mouth daily. 04/19/17   [provider]     Allergies Patient has no known allergies.   Family History  Problem Relation Age of Onset  . Colon cancer Mother   . Hypertension Mother   . Colon cancer Sister   . Diabetes Sister     Social  History Social History   Tobacco Use  . Smoking status: Never Smoker  . Smokeless tobacco: Never Used  Substance Use Topics  . Alcohol use: No  . Drug use: No    Review of Systems  Constitutional:   No fever or chills.  ENT:   No sore throat. No rhinorrhea. Cardiovascular:   No chest pain or syncope. Respiratory:   No dyspnea or cough. Gastrointestinal:   Negative for abdominal pain, vomiting and diarrhea.  Musculoskeletal:   Negative for focal pain or swelling All other systems reviewed and are negative except as documented above in ROS and HPI.  ____________________________________________   PHYSICAL EXAM:  VITAL SIGNS: ED Triage Vitals  Enc Vitals Group     BP 06/11/19 1609 (!) 142/74     Pulse Rate 06/11/19 1609 73     Resp 06/11/19 1609 15     Temp 06/11/19 1609 98 F (36.7 C)     Temp Source 06/11/19 1609 Oral     SpO2 06/11/19 1609 98 %     Weight 06/11/19 1607 171 lb (77.6 kg)     Height 06/11/19 1607 5\' 6"  (1.676 m)     Head Circumference --      Peak Flow --      Pain Score 06/11/19 1607 0     Pain Loc --      Pain Edu? --      Excl. in [place]? --     Vital signs reviewed, nursing assessments reviewed.   Constitutional:   Alert and oriented. Non-toxic appearance. Eyes:   Conjunctivae are normal. EOMI. PERRL. ENT      Head:   Normocephalic and atraumatic.      Nose:   Wearing a mask.      Mouth/Throat:   Wearing a mask.      Neck:   No meningismus. Full ROM. Hematological/Lymphatic/Immunilogical:   No cervical lymphadenopathy. Cardiovascular:   RRR. Symmetric bilateral radial and DP pulses.  No murmurs. Cap refill less than 2 seconds. Respiratory:   Normal respiratory effort without tachypnea/retractions. Breath sounds are clear and equal bilaterally. No wheezes/rales/rhonchi. Gastrointestinal:   Soft and nontender. Non distended. There is no CVA tenderness.  No rebound, rigidity, or guarding.  Musculoskeletal:   Normal range of motion in all  extremities. No joint effusions.  No lower extremity tenderness.  No edema. Neurologic:   Normal speech and language.  Motor grossly intact. No acute focal neurologic deficits are appreciated.  Skin:    Skin is warm, dry and intact. No rash noted.  No petechiae, purpura, or bullae.  ____________________________________________    LABS (pertinent positives/negatives) (all labs ordered are listed, but only abnormal results are displayed) Labs Reviewed  GLUCOSE, CAPILLARY - Abnormal; Notable for the following components:      Result Value   Glucose-Capillary 260 (*)  All other components within normal limits  COMPREHENSIVE METABOLIC PANEL - Abnormal; Notable for the following components:   Glucose, Bld 266 (*)    Alkaline Phosphatase 201 (*)    All other components within normal limits  URINALYSIS, COMPLETE (UACMP) WITH MICROSCOPIC - Abnormal; Notable for the following components:   Color, Urine STRAW (*)    APPearance CLEAR (*)    Glucose, UA >=500 (*)    Hgb urine dipstick MODERATE (*)    All other components within normal limits  CBC  CBG MONITORING, ED   ____________________________________________   EKG    ____________________________________________    RADIOLOGY  No results found.  ____________________________________________   PROCEDURES Procedures  ____________________________________________    CLINICAL IMPRESSION / ASSESSMENT AND PLAN / ED COURSE  Medications ordered in the ED: Medications - No data to display  Pertinent labs & imaging results that were available during my care of the patient were reviewed by me and considered in my medical decision making (see chart for details).  Barbara Grant was evaluated in Emergency Department on 06/11/2019 for the symptoms described in the history of present illness. She was evaluated in the context of the global COVID-19 pandemic, which necessitated consideration that the patient might be at risk for  infection with the SARS-CoV-2 virus that causes COVID-19. Institutional protocols and algorithms that pertain to the evaluation of patients at risk for COVID-19 are in a state of rapid change based on information released by regulatory bodies including the CDC and federal and state organizations. These policies and algorithms were followed during the patient's care in the ED.   Patient presents with mild hyperglycemia.  Other labs are okay.  Vital signs are unremarkable, exam is benign and reassuring.  Encouraged the patient to start taking Metformin.  I will start with just 1 time a day to see if this is better tolerated for now as her body adjusts to the medicine.  All provide her prescription for Zofran in case she has nausea with it.  Recommend she follow-up with her doctor in about a week for reassessment of her blood sugar and medication regimen at that time.      ____________________________________________   FINAL CLINICAL IMPRESSION(S) / ED DIAGNOSES    Final diagnoses:  Type 2 diabetes mellitus with hyperglycemia, without long-term current use of insulin [facility])     ED Discharge Orders         Ordered    metFORMIN (GLUCOPHAGE) 500 MG tablet  Daily with breakfast     06/11/19 2014    ondansetron (ZOFRAN ODT) 4 MG disintegrating tablet  Every 8 hours PRN     06/11/19 2014          Portions of this Grant were generated with dragon dictation software. Dictation errors may occur despite best attempts at proofreading.   Sharman Cheek, MD 06/11/19 2016

## 2019-06-11 NOTE — ED Notes (Signed)
Discussed pt with Dr Mayford Knife - he can VO for CBC, CMP, Urinalysis - he states that pt does not need an IV at this time

## 2020-07-16 ENCOUNTER — Encounter (HOSPITAL_COMMUNITY): Payer: Self-pay

## 2020-07-16 ENCOUNTER — Other Ambulatory Visit: Payer: Self-pay

## 2020-07-16 ENCOUNTER — Emergency Department (HOSPITAL_COMMUNITY)
Admission: EM | Admit: 2020-07-16 | Discharge: 2020-07-16 | Disposition: A | Discharge status: Home / Self Care | Payer: Medicare Other | Attending: Emergency Medicine | Admitting: Emergency Medicine

## 2020-07-16 DIAGNOSIS — H81399 Other peripheral vertigo, unspecified ear: Secondary | ICD-10-CM | POA: Diagnosis not present

## 2020-07-16 DIAGNOSIS — E119 Type 2 diabetes mellitus without complications: Secondary | ICD-10-CM | POA: Insufficient documentation

## 2020-07-16 DIAGNOSIS — Z79899 Other long term (current) drug therapy: Secondary | ICD-10-CM | POA: Insufficient documentation

## 2020-07-16 DIAGNOSIS — R42 Dizziness and giddiness: Secondary | ICD-10-CM | POA: Diagnosis present

## 2020-07-16 DIAGNOSIS — Z7984 Long term (current) use of oral hypoglycemic drugs: Secondary | ICD-10-CM | POA: Insufficient documentation

## 2020-07-16 DIAGNOSIS — R519 Headache, unspecified: Secondary | ICD-10-CM | POA: Diagnosis not present

## 2020-07-16 DIAGNOSIS — I10 Essential (primary) hypertension: Secondary | ICD-10-CM | POA: Insufficient documentation

## 2020-07-16 MED ORDER — MECLIZINE HCL 12.5 MG PO TABS: 12.5000 mg | ORAL_TABLET | Freq: Three times a day (TID) | ORAL | 0 refills | Status: DC | PRN

## 2020-07-16 MED ORDER — MECLIZINE HCL 25 MG PO TABS
12.5000 mg | ORAL_TABLET | Freq: Once | ORAL | Status: AC
  Administered 2020-07-16: 12.5 mg via ORAL
  Filled 2020-07-16: qty 1

## 2020-07-16 NOTE — ED Triage Notes (Signed)
Pt c/o dizziness x 1 week. Pt states she's able to walk and drive okay. Pt states she had vertigo 3 yrs ago. Pt c/o nausea. Pt also c/o headache from falling 5 months ago

## 2020-07-16 NOTE — ED Notes (Signed)
An After Visit Summary was printed and given to the patient. Discharge instructions given and no further questions at this time.  

## 2020-07-16 NOTE — ED Provider Notes (Signed)
[facility]-EMERGENCY DEPT Provider Note   CSN: [id number] Arrival date & time: 07/16/20  1603     History Chief Complaint  Patient presents with  . Dizziness    Barbara Grant is a 75 y.o. female.  HPI   Patient presents to the ED for evaluation of dizziness.  Patient states his symptoms started about a week ago.  She notices it intermittently throughout the day but especially when she first wakes up in the morning.  Patient noticed when she gets up and moves around she starts having room spinning sensation.  Certain motions and movements increase the symptoms.  She feels better when she is at rest.  She has not had any trouble with her vision.  No focal numbness or weakness.  No ataxia.  Patient has had some intermittent headaches after hitting her head and falling 5 months ago but has not had any recent falls.  Patient states she has had vertigo in the past.  She feels that this is similar.  Past Medical History:  Diagnosis Date  . Arthritis   . Diabetes mellitus   . Diabetes mellitus without complication (HCC)   . GERD (gastroesophageal reflux disease)   . HTN (hypertension)   . Hypertension   . Osteoporosis     Patient Active Problem List   Diagnosis Date Noted  . Colon polyps 03/05/2011  . Dyspepsia 03/05/2011  . HTN (hypertension) 03/05/2011  . DM (diabetes mellitus) (HCC) 03/05/2011    Past Surgical History:  Procedure Laterality Date  . HERNIA REPAIR       OB History   No obstetric history on file.     Family History  Problem Relation Age of Onset  . Colon cancer Mother   . Hypertension Mother   . Colon cancer Sister   . Diabetes Sister     Social History   Tobacco Use  . Smoking status: Never Smoker  . Smokeless tobacco: Never Used  Vaping Use  . Vaping Use: Never used  Substance Use Topics  . Alcohol use: No  . Drug use: No    Home Medications Prior to Admission medications   Medication Sig Start Date End Date  Taking? Authorizing Provider  meclizine (ANTIVERT) 12.5 MG tablet Take 1-2 tablets (12.5-25 mg total) by mouth 3 (three) times daily as needed for dizziness. 07/16/20  Yes Linwood Dibbles, MD  alendronate (FOSAMAX) 70 MG tablet Take 1 tablet (70 mg total) by mouth every 7 (seven) days. Take with a full glass of water on an empty stomach. 12/14/12   Copland, Gwenlyn Found, MD  atorvastatin (LIPITOR) 40 MG tablet Take 40 mg by mouth daily. 04/24/17   [provider]  bisoprolol-hydrochlorothiazide (ZIAC) 5-6.25 MG per tablet Take 1 tablet by mouth daily. Patient not taking: Reported on 07/27/2017 12/14/12   Copland, Gwenlyn Found, MD  cyclobenzaprine (FLEXERIL) 10 MG tablet Take 1 tablet (10 mg total) by mouth 2 (two) times daily as needed for muscle spasms. Patient not taking: Reported on 07/27/2017 04/30/17   Alvira Monday, MD  furosemide (LASIX) 20 MG tablet Take 20 mg by mouth daily. 04/29/17   [provider]  gabapentin (NEURONTIN) 100 MG capsule Take 1 capsule (100 mg total) by mouth 3 (three) times daily. 09/01/17 10/01/17  Alvira Monday, MD  glipiZIDE (GLUCOTROL) 10 MG tablet Take 10 mg by mouth 2 (two) times daily before a meal.  03/02/17   [provider]  glucose blood (CHOICE DM FORA G20 TEST STRIPS) test  strip Use as instructed 12/20/12   Rhoderick Moody M, PA-C  Lancets MISC To check blood sugar tid dx code 250.00 accu check meter 12/20/12   Rhoderick Moody M, PA-C  lansoprazole (PREVACID) 30 MG capsule Take 1 capsule (30 mg total) by mouth daily. Patient not taking: Reported on 07/27/2017 12/14/12   Copland, Gwenlyn Found, MD  lisinopril (PRINIVIL,ZESTRIL) 5 MG tablet Take 1 tablet (5 mg total) by mouth daily. Patient not taking: Reported on 07/27/2017 12/14/12   Copland, Gwenlyn Found, MD  losartan (COZAAR) 100 MG tablet Take 100 mg by mouth daily. 03/02/17   [provider]  meloxicam (MOBIC) 7.5 MG tablet Take 7.5 mg by mouth daily as needed for pain.    [provider]   metFORMIN (GLUCOPHAGE) 500 MG tablet Take 1 tablet (500 mg total) by mouth daily with breakfast. 06/11/19   Sharman Cheek, MD  ondansetron (ZOFRAN ODT) 4 MG disintegrating tablet Take 1 tablet (4 mg total) by mouth every 8 (eight) hours as needed for nausea or vomiting. 06/11/19   Sharman Cheek, MD  pantoprazole (PROTONIX) 40 MG tablet Take 40 mg by mouth daily. 04/19/17   [provider]    Allergies    Patient has no known allergies.  Review of Systems   Review of Systems  All other systems reviewed and are negative.   Physical Exam Updated Vital Signs BP (!) 141/80   Pulse 80   Temp 98.4 F (36.9 C) (Oral)   Resp 18   Ht 1.676 m (5\' 6" )   Wt 72.6 kg   SpO2 99%   BMI 25.82 kg/m   Physical Exam Vitals and nursing note reviewed.  Constitutional:      General: She is not in acute distress.    Appearance: She is well-developed and well-nourished.  HENT:     Head: Normocephalic and atraumatic.     Right Ear: External ear normal.     Left Ear: External ear normal.     Mouth/Throat:     Mouth: Oropharynx is clear and moist.  Eyes:     General: No scleral icterus.       Right eye: No discharge.        Left eye: No discharge.     Conjunctiva/sclera: Conjunctivae normal.     Comments: Nystagmus, horizontal  Neck:     Trachea: No tracheal deviation.  Cardiovascular:     Rate and Rhythm: Normal rate and regular rhythm.     Pulses: Intact distal pulses.  Pulmonary:     Effort: Pulmonary effort is normal. No respiratory distress.     Breath sounds: Normal breath sounds. No stridor. No wheezing or rales.  Abdominal:     General: Bowel sounds are normal. There is no distension.     Palpations: Abdomen is soft.     Tenderness: There is no abdominal tenderness. There is no guarding or rebound.  Musculoskeletal:        General: No tenderness or edema.     Cervical back: Neck supple.  Skin:    General: Skin is warm and dry.     Findings: No rash.   Neurological:     Mental Status: She is alert and oriented to person, place, and time.     Cranial Nerves: No cranial nerve deficit (no facial droop, extraocular movements intact, no slurred speech).     Sensory: No sensory deficit.     Motor: No abnormal muscle tone or seizure activity.  Coordination: Coordination normal.     Deep Tendon Reflexes: Strength normal.     Comments: No pronator drift bilateral upper extrem, able to hold both legs off bed for 5 seconds, sensation intact in all extremities, no visual field cuts, no left or right sided neglect, normal finger-nose exam bilaterally, no nystagmus noted   Psychiatric:        Mood and Affect: Mood and affect normal.     ED Results / Procedures / Treatments   Labs (all labs ordered are listed, but only abnormal results are displayed) Labs Reviewed - No data to display  EKG None  Radiology No results found.  Procedures Procedures   Medications Ordered in ED Medications  meclizine (ANTIVERT) tablet 12.5 mg (12.5 mg Oral Given 07/16/20 1923)    ED Course  I have reviewed the triage vital signs and the nursing notes.  Pertinent labs & imaging results that were available during my care of the patient were reviewed by me and considered in my medical decision making (see chart for details).    MDM Rules/Calculators/A&P                          Patient presents with symptoms suggestive of peripheral vertigo.  Considering her age and complaints of head injury previously with headache I did discuss possible of doing a head CT as well as laboratory tests.  Patient wanted how long that will take and explained it does take a couple of hours to those results.  Patient states she does not want to wait that long.  She would like to try taking something for her vertigo and following up with her doctor if it does not improve. Final Clinical Impression(s) / ED Diagnoses Final diagnoses:  Peripheral vertigo, unspecified laterality     Rx / DC Orders ED Discharge Orders         Ordered    meclizine (ANTIVERT) 12.5 MG tablet  3 times daily PRN        07/16/20 1924           Linwood Dibbles, MD 07/16/20 1924

## 2020-07-16 NOTE — Discharge Instructions (Signed)
Take the medication as needed for the dizziness.  Follow-up with your doctor for further evaluation as we discussed

## 2021-05-07 ENCOUNTER — Other Ambulatory Visit: Payer: Self-pay

## 2021-05-07 ENCOUNTER — Emergency Department (HOSPITAL_COMMUNITY): Payer: Medicare Other

## 2021-05-07 ENCOUNTER — Encounter (HOSPITAL_COMMUNITY): Payer: Self-pay | Admitting: Emergency Medicine

## 2021-05-07 ENCOUNTER — Emergency Department (HOSPITAL_COMMUNITY)
Admission: EM | Admit: 2021-05-07 | Discharge: 2021-05-07 | Disposition: A | Discharge status: Home / Self Care | Payer: Medicare Other | Attending: Emergency Medicine | Admitting: Emergency Medicine

## 2021-05-07 DIAGNOSIS — K219 Gastro-esophageal reflux disease without esophagitis: Secondary | ICD-10-CM | POA: Diagnosis not present

## 2021-05-07 DIAGNOSIS — Z7984 Long term (current) use of oral hypoglycemic drugs: Secondary | ICD-10-CM | POA: Insufficient documentation

## 2021-05-07 DIAGNOSIS — R1011 Right upper quadrant pain: Secondary | ICD-10-CM

## 2021-05-07 DIAGNOSIS — E119 Type 2 diabetes mellitus without complications: Secondary | ICD-10-CM | POA: Diagnosis not present

## 2021-05-07 DIAGNOSIS — R1013 Epigastric pain: Secondary | ICD-10-CM

## 2021-05-07 DIAGNOSIS — I10 Essential (primary) hypertension: Secondary | ICD-10-CM | POA: Diagnosis not present

## 2021-05-07 DIAGNOSIS — Z86010 Personal history of colonic polyps: Secondary | ICD-10-CM | POA: Insufficient documentation

## 2021-05-07 LAB — CBC WITH DIFFERENTIAL/PLATELET
Abs Immature Granulocytes: 0.02 10*3/uL (ref 0.00–0.07)
Basophils Absolute: 0 10*3/uL (ref 0.0–0.1)
Basophils Relative: 0 %
Eosinophils Absolute: 0 10*3/uL (ref 0.0–0.5)
Eosinophils Relative: 1 %
HCT: 38.7 % (ref 36.0–46.0)
Hemoglobin: 12.6 g/dL (ref 12.0–15.0)
Immature Granulocytes: 0 %
Lymphocytes Relative: 43 %
Lymphs Abs: 3.5 10*3/uL (ref 0.7–4.0)
MCH: 29.7 pg (ref 26.0–34.0)
MCHC: 32.6 g/dL (ref 30.0–36.0)
MCV: 91.3 fL (ref 80.0–100.0)
Monocytes Absolute: 0.5 10*3/uL (ref 0.1–1.0)
Monocytes Relative: 6 %
Neutro Abs: 4 10*3/uL (ref 1.7–7.7)
Neutrophils Relative %: 50 %
Platelets: 212 10*3/uL (ref 150–400)
RBC: 4.24 MIL/uL (ref 3.87–5.11)
RDW: 13.6 % (ref 11.5–15.5)
WBC: 8 10*3/uL (ref 4.0–10.5)
nRBC: 0 % (ref 0.0–0.2)

## 2021-05-07 LAB — COMPREHENSIVE METABOLIC PANEL
ALT: 19 U/L (ref 0–44)
AST: 22 U/L (ref 15–41)
Albumin: 4.1 g/dL (ref 3.5–5.0)
Alkaline Phosphatase: 79 U/L (ref 38–126)
Anion gap: 9 (ref 5–15)
BUN: 20 mg/dL (ref 8–23)
CO2: 24 mmol/L (ref 22–32)
Calcium: 9.4 mg/dL (ref 8.9–10.3)
Chloride: 104 mmol/L (ref 98–111)
Creatinine, Ser: 0.72 mg/dL (ref 0.44–1.00)
GFR, Estimated: >60 mL/min (ref >60)
Glucose, Bld: 92 mg/dL (ref 70–99)
Potassium: 4.1 mmol/L (ref 3.5–5.1)
Sodium: 137 mmol/L (ref 135–145)
Total Bilirubin: 0.6 mg/dL (ref 0.3–1.2)
Total Protein: 7.7 g/dL (ref 6.5–8.1)

## 2021-05-07 LAB — LIPASE, BLOOD: Lipase: 48 U/L (ref 11–51)

## 2021-05-07 MED ORDER — PANTOPRAZOLE SODIUM 40 MG PO TBEC
40.0000 mg | DELAYED_RELEASE_TABLET | Freq: Every day | ORAL | Status: DC
  Administered 2021-05-07: 21:00:00 40 mg via ORAL
  Filled 2021-05-07: qty 1

## 2021-05-07 MED ORDER — LIDOCAINE VISCOUS HCL 2 % MT SOLN
15.0000 mL | Freq: Once | OROMUCOSAL | Status: AC
  Administered 2021-05-07: 21:00:00 15 mL via ORAL
  Filled 2021-05-07: qty 15

## 2021-05-07 MED ORDER — ALUM & MAG HYDROXIDE-SIMETH 200-200-20 MG/5ML PO SUSP
30.0000 mL | Freq: Once | ORAL | Status: AC
  Administered 2021-05-07: 21:00:00 30 mL via ORAL
  Filled 2021-05-07: qty 30

## 2021-05-07 MED ORDER — OMEPRAZOLE 20 MG PO CPDR: 40.0000 mg | DELAYED_RELEASE_CAPSULE | Freq: Every day | ORAL | 0 refills | Status: AC

## 2021-05-07 NOTE — ED Provider Notes (Incomplete)
[facility]-EMERGENCY DEPT Provider Note   CSN: [id number] Arrival date & time: 05/07/21  1608     History Chief Complaint  Patient presents with   Abdominal Pain    Barbara Grant is a 75 y.o. female.  HPI       Past Medical History:  Diagnosis Date   Arthritis    Diabetes mellitus    Diabetes mellitus without complication (HCC)    GERD (gastroesophageal reflux disease)    HTN (hypertension)    Hypertension    Osteoporosis     Patient Active Problem List   Diagnosis Date Noted   Colon polyps 03/05/2011   Dyspepsia 03/05/2011   HTN (hypertension) 03/05/2011   DM (diabetes mellitus) (HCC) 03/05/2011    Past Surgical History:  Procedure Laterality Date   HERNIA REPAIR       OB History   No obstetric history on file.     Family History  Problem Relation Age of Onset   Colon cancer Mother    Hypertension Mother    Colon cancer Sister    Diabetes Sister     Social History   Tobacco Use   Smoking status: Never   Smokeless tobacco: Never  Vaping Use   Vaping Use: Never used  Substance Use Topics   Alcohol use: No   Drug use: No    Home Medications Prior to Admission medications   Medication Sig Start Date End Date Taking? Authorizing Provider  alendronate (FOSAMAX) 70 MG tablet Take 1 tablet (70 mg total) by mouth every 7 (seven) days. Take with a full glass of water on an empty stomach. 12/14/12   Copland, Gwenlyn Found, MD  atorvastatin (LIPITOR) 40 MG tablet Take 40 mg by mouth daily. 04/24/17   [provider]  bisoprolol-hydrochlorothiazide (ZIAC) 5-6.25 MG per tablet Take 1 tablet by mouth daily. Patient not taking: Reported on 07/27/2017 12/14/12   Copland, Gwenlyn Found, MD  cyclobenzaprine (FLEXERIL) 10 MG tablet Take 1 tablet (10 mg total) by mouth 2 (two) times daily as needed for muscle spasms. Patient not taking: Reported on 07/27/2017 04/30/17   Alvira Monday, MD  furosemide (LASIX) 20 MG tablet Take 20 mg by  mouth daily. 04/29/17   [provider]  gabapentin (NEURONTIN) 100 MG capsule Take 1 capsule (100 mg total) by mouth 3 (three) times daily. 09/01/17 10/01/17  Alvira Monday, MD  glipiZIDE (GLUCOTROL) 10 MG tablet Take 10 mg by mouth 2 (two) times daily before a meal.  03/02/17   [provider]  glucose blood (CHOICE DM FORA G20 TEST STRIPS) test strip Use as instructed 12/20/12   Nelva Nay, PA-C  Lancets MISC To check blood sugar tid dx code 250.00 accu check meter 12/20/12   Rhoderick Moody M, PA-C  lansoprazole (PREVACID) 30 MG capsule Take 1 capsule (30 mg total) by mouth daily. Patient not taking: Reported on 07/27/2017 12/14/12   Copland, Gwenlyn Found, MD  lisinopril (PRINIVIL,ZESTRIL) 5 MG tablet Take 1 tablet (5 mg total) by mouth daily. Patient not taking: Reported on 07/27/2017 12/14/12   Copland, Gwenlyn Found, MD  losartan (COZAAR) 100 MG tablet Take 100 mg by mouth daily. 03/02/17   [provider]  meclizine (ANTIVERT) 12.5 MG tablet Take 1-2 tablets (12.5-25 mg total) by mouth 3 (three) times daily as needed for dizziness. 07/16/20   Linwood Dibbles, MD  meloxicam (MOBIC) 7.5 MG tablet Take 7.5 mg by mouth daily as needed for pain.    [provider]  metFORMIN (GLUCOPHAGE) 500 MG tablet Take 1 tablet (500 mg total) by mouth daily with breakfast. 06/11/19   Sharman Cheek, MD  ondansetron (ZOFRAN ODT) 4 MG disintegrating tablet Take 1 tablet (4 mg total) by mouth every 8 (eight) hours as needed for nausea or vomiting. 06/11/19   Sharman Cheek, MD  pantoprazole (PROTONIX) 40 MG tablet Take 40 mg by mouth daily. 04/19/17   [provider]    Allergies    Patient has no known allergies.  Review of Systems   Review of Systems  Physical Exam Updated Vital Signs BP 130/82 (BP Location: Right Arm)    Pulse 77    Temp 98.4 F (36.9 C) (Oral)    Resp 18    SpO2 100%   Physical Exam  ED Results / Procedures / Treatments   Labs (all labs ordered  are listed, but only abnormal results are displayed) Labs Reviewed  CBC WITH DIFFERENTIAL/PLATELET  COMPREHENSIVE METABOLIC PANEL  LIPASE, BLOOD  URINALYSIS, ROUTINE W REFLEX MICROSCOPIC    EKG None  Radiology No results found.  Procedures Procedures   Medications Ordered in ED Medications - No data to display  ED Course  I have reviewed the triage vital signs and the nursing notes.  Pertinent labs & imaging results that were available during my care of the patient were reviewed by me and considered in my medical decision making (see chart for details).    MDM Rules/Calculators/A&P                         *** {Remember to document critical care time when appropriate:1}   Final Clinical Impression(s) / ED Diagnoses Final diagnoses:  None    Rx / DC Orders ED Discharge Orders     None

## 2021-05-07 NOTE — ED Provider Notes (Signed)
Emergency Medicine Provider Triage Evaluation Note  Barbara Grant , a 75 y.o. female  was evaluated in triage.  Pt complains of patient complains of epigastric abdominal pain radiating down into the left quadrant.  This is been going on for about a week.  Patient was taking pantoprazole but stopped it because it "made her colon dry."  She was told by the pharmacy to quit taking this medication.  She denies nausea vomiting or diarrhea.  She denies constipation..  Review of Systems  Positive: Abd pain Negative: vomiting  Physical Exam  BP 130/82 (BP Location: Right Arm)    Pulse 77    Temp 98.4 F (36.9 C) (Oral)    Resp 18    SpO2 100%  Gen:   Awake, no distress   Resp:  Normal effort  MSK:   Moves extremities without difficulty  Other:  No tenderness  Medical Decision Making  Medically screening exam initiated at 4:59 PM.  Appropriate orders placed.  Sharmeka Youngman was informed that the remainder of the evaluation will be completed by another provider, this initial triage assessment does not replace that evaluation, and the importance of remaining in the ED until their evaluation is complete.  Abd pain.   Arthor Captain, PA-C 05/07/21 1700    Alvira Monday, MD 05/08/21 1547

## 2021-05-07 NOTE — ED Triage Notes (Signed)
Patient c/o upper abdominal pain and reflux x 1 week. States she was taking protonix but was having foul odor to her urine so pharmacy told her to stop.

## 2021-05-07 NOTE — ED Provider Notes (Signed)
[facility]-EMERGENCY DEPT Provider Note   CSN: [id number] Arrival date & time: 05/07/21  1608     History Chief Complaint  Patient presents with   Abdominal Pain    Barbara Grant is a 75 y.o. female.  HPI     75yo female with history of DM, htn, hlpd, who presents with concern for abdominal pain.  1 week since it began, have a lot of burning in the stomach and pain Feels pain especially with eating  Feel that have a lot of acid No nausea or vomiting, diarrhea or constipation No black or bloody stools No blood in urine or pain with urination No fever Epigastric pain radiates towards luq No hx of pain like this before Stopped taking pantoprazole one week ago, felt like it was drying up her colon, causing dysuria and discussed this with pharmacist who said that could be a side effect and that she has been on it for so long and recommended discontinuing it  Spanish interpreter used  Past Medical History:  Diagnosis Date   Arthritis    Diabetes mellitus    Diabetes mellitus without complication (HCC)    GERD (gastroesophageal reflux disease)    HTN (hypertension)    Hypertension    Osteoporosis     Patient Active Problem List   Diagnosis Date Noted   Colon polyps 03/05/2011   Dyspepsia 03/05/2011   HTN (hypertension) 03/05/2011   DM (diabetes mellitus) (HCC) 03/05/2011    Past Surgical History:  Procedure Laterality Date   HERNIA REPAIR       OB History   No obstetric history on file.     Family History  Problem Relation Age of Onset   Colon cancer Mother    Hypertension Mother    Colon cancer Sister    Diabetes Sister     Social History   Tobacco Use   Smoking status: Never   Smokeless tobacco: Never  Vaping Use   Vaping Use: Never used  Substance Use Topics   Alcohol use: No   Drug use: No    Home Medications Prior to Admission medications   Medication Sig Start Date End Date Taking? Authorizing Provider   omeprazole (PRILOSEC) 20 MG capsule Take 2 capsules (40 mg total) by mouth daily for 14 days. 05/07/21 05/21/21 Yes Alvira Monday, MD  alendronate (FOSAMAX) 70 MG tablet Take 1 tablet (70 mg total) by mouth every 7 (seven) days. Take with a full glass of water on an empty stomach. 12/14/12   Copland, Gwenlyn Found, MD  atorvastatin (LIPITOR) 40 MG tablet Take 40 mg by mouth daily. 04/24/17   [provider]  bisoprolol-hydrochlorothiazide (ZIAC) 5-6.25 MG per tablet Take 1 tablet by mouth daily. Patient not taking: Reported on 07/27/2017 12/14/12   Copland, Gwenlyn Found, MD  cyclobenzaprine (FLEXERIL) 10 MG tablet Take 1 tablet (10 mg total) by mouth 2 (two) times daily as needed for muscle spasms. Patient not taking: Reported on 07/27/2017 04/30/17   Alvira Monday, MD  furosemide (LASIX) 20 MG tablet Take 20 mg by mouth daily. 04/29/17   [provider]  gabapentin (NEURONTIN) 100 MG capsule Take 1 capsule (100 mg total) by mouth 3 (three) times daily. 09/01/17 10/01/17  Alvira Monday, MD  glipiZIDE (GLUCOTROL) 10 MG tablet Take 10 mg by mouth 2 (two) times daily before a meal.  03/02/17   [provider]  glucose blood (CHOICE DM FORA G20 TEST STRIPS) test strip Use as instructed 12/20/12  Georgiann Mccoy M, PA-C  Lancets MISC To check blood sugar tid dx code 250.00 accu check meter 12/20/12   Georgiann Mccoy M, PA-C  lisinopril (PRINIVIL,ZESTRIL) 5 MG tablet Take 1 tablet (5 mg total) by mouth daily. Patient not taking: Reported on 07/27/2017 12/14/12   Copland, Gay Filler, MD  losartan (COZAAR) 100 MG tablet Take 100 mg by mouth daily. 03/02/17   [provider]  meclizine (ANTIVERT) 12.5 MG tablet Take 1-2 tablets (12.5-25 mg total) by mouth 3 (three) times daily as needed for dizziness. 07/16/20   Dorie Rank, MD  meloxicam (MOBIC) 7.5 MG tablet Take 7.5 mg by mouth daily as needed for pain.    [provider]  metFORMIN (GLUCOPHAGE) 500 MG tablet Take 1 tablet  (500 mg total) by mouth daily with breakfast. 06/11/19   Carrie Mew, MD  ondansetron (ZOFRAN ODT) 4 MG disintegrating tablet Take 1 tablet (4 mg total) by mouth every 8 (eight) hours as needed for nausea or vomiting. 06/11/19   Carrie Mew, MD    Allergies    Patient has no known allergies.  Review of Systems   Review of Systems  Constitutional:  Negative for fever.  HENT:  Negative for sore throat.   Eyes:  Negative for visual disturbance.  Respiratory:  Negative for cough and shortness of breath.   Cardiovascular:  Negative for chest pain.  Gastrointestinal:  Positive for abdominal pain. Negative for constipation, diarrhea, nausea and vomiting.  Genitourinary:  Negative for difficulty urinating and dysuria.  Musculoskeletal:  Negative for back pain and neck pain.  Skin:  Negative for rash.  Neurological:  Negative for syncope and headaches.   Physical Exam Updated Vital Signs BP 129/70    Pulse 68    Temp 98.2 F (36.8 C) (Oral)    Resp 16    SpO2 96%   Physical Exam Vitals and nursing note reviewed.  Constitutional:      General: She is not in acute distress.    Appearance: She is well-developed. She is not diaphoretic.  HENT:     Head: Normocephalic and atraumatic.  Eyes:     Conjunctiva/sclera: Conjunctivae normal.  Cardiovascular:     Rate and Rhythm: Normal rate and regular rhythm.     Heart sounds: Normal heart sounds. No murmur heard.   No friction rub. No gallop.  Pulmonary:     Effort: Pulmonary effort is normal. No respiratory distress.     Breath sounds: Normal breath sounds. No wheezing or rales.  Abdominal:     General: There is no distension.     Palpations: Abdomen is soft.     Tenderness: There is abdominal tenderness in the right upper quadrant, epigastric area and left upper quadrant. There is no guarding.  Musculoskeletal:        General: No tenderness.     Cervical back: Normal range of motion.  Skin:    General: Skin is warm and dry.      Findings: No erythema or rash.  Neurological:     Mental Status: She is alert and oriented to person, place, and time.    ED Results / Procedures / Treatments   Labs (all labs ordered are listed, but only abnormal results are displayed) Labs Reviewed  CBC WITH DIFFERENTIAL/PLATELET  COMPREHENSIVE METABOLIC PANEL  LIPASE, BLOOD    EKG EKG Interpretation  Date/Time:  Tuesday May 07 2021 21:11:51 EST Ventricular Rate:  67 PR Interval:  158 QRS Duration: 91 QT Interval:  430  QTC Calculation: [id number] R Axis:   -19 Text Interpretation: Sinus rhythm Borderline left axis deviation No significant change since last tracing Confirmed by Gareth Morgan [phone]) on 05/07/2021 10:00:29 PM  Radiology US Abdomen Limited RUQ (LIVER/GB)  Result Date: 05/07/2021 CLINICAL DATA:  Right upper quadrant abdominal pain. EXAM: ULTRASOUND ABDOMEN LIMITED RIGHT UPPER QUADRANT COMPARISON:  CT abdomen pelvis dated 09/04/2011. FINDINGS: Gallbladder: No gallstones or wall thickening visualized. No sonographic Murphy sign noted by sonographer. Common bile duct: Diameter: 3 mm Liver: No focal lesion identified. Within normal limits in parenchymal echogenicity. Portal vein is patent on color Doppler imaging with normal direction of blood flow towards the liver. Other: None. IMPRESSION: Unremarkable right upper quadrant ultrasound. Electronically Signed   By: Anner Crete M.D.   On: 05/07/2021 21:46    Procedures Procedures   Medications Ordered in ED Medications  alum & mag hydroxide-simeth (MAALOX/MYLANTA) 200-200-20 MG/5ML suspension 30 mL (30 mLs Oral Given 05/07/21 2112)    And  lidocaine (XYLOCAINE) 2 % viscous mouth solution 15 mL (15 mLs Oral Given 05/07/21 2112)    ED Course  I have reviewed the triage vital signs and the nursing notes.  Pertinent labs & imaging results that were available during my care of the patient were reviewed by me and considered in my medical decision making (see  chart for details).    MDM Rules/Calculators/A&P                          76yo female with history of DM, htn, hlpd, who presents with concern for abdominal pain.  DDx includes appendicitis, pancreatitis, cholecystitis, pyelonephritis, nephrolithiasis, diverticulitis, SBO, AAA.  History and exam not consistent with appendicitis, pyelonephritis, diverticulitis, SBO, AAA.  No chest pain, has tenderness on exam and doubt cardiac etiology.    Labs without pancreatitis, hepatitis. RUQ [place] without GB pathology.  History of burning pain in the epigastrium that worsens with eating is most consistent with PUD, gastritis or GERD. Improved with pantoprazole and GI cocktail in the ED.  Recommend restarting the pantoprazole however she is worried she will have side effects, agrees to starting omeprazole. Recommend follow up with her GI physician and PCP. Patient discharged in stable condition with understanding of reasons to return.        Final Clinical Impression(s) / ED Diagnoses Final diagnoses:  RUQ abdominal pain  Epigastric abdominal pain  Gastroesophageal reflux disease, unspecified whether esophagitis present    Rx / DC Orders ED Discharge Orders          Ordered    omeprazole (PRILOSEC) 20 MG capsule  Daily        05/07/21 2220             Gareth Morgan, MD 05/08/21 1528

## 2021-12-11 ENCOUNTER — Encounter (HOSPITAL_COMMUNITY): Payer: Self-pay

## 2021-12-11 ENCOUNTER — Emergency Department (HOSPITAL_COMMUNITY)
Admission: EM | Admit: 2021-12-11 | Discharge: 2021-12-11 | Disposition: A | Discharge status: Home / Self Care | Payer: Medicare Other | Attending: Emergency Medicine | Admitting: Emergency Medicine

## 2021-12-11 DIAGNOSIS — R42 Dizziness and giddiness: Secondary | ICD-10-CM | POA: Diagnosis not present

## 2021-12-11 DIAGNOSIS — E1165 Type 2 diabetes mellitus with hyperglycemia: Secondary | ICD-10-CM | POA: Diagnosis not present

## 2021-12-11 DIAGNOSIS — Z79899 Other long term (current) drug therapy: Secondary | ICD-10-CM | POA: Insufficient documentation

## 2021-12-11 DIAGNOSIS — H9201 Otalgia, right ear: Secondary | ICD-10-CM | POA: Insufficient documentation

## 2021-12-11 DIAGNOSIS — H60502 Unspecified acute noninfective otitis externa, left ear: Secondary | ICD-10-CM | POA: Insufficient documentation

## 2021-12-11 DIAGNOSIS — Z7984 Long term (current) use of oral hypoglycemic drugs: Secondary | ICD-10-CM | POA: Insufficient documentation

## 2021-12-11 DIAGNOSIS — I10 Essential (primary) hypertension: Secondary | ICD-10-CM | POA: Insufficient documentation

## 2021-12-11 LAB — CBC WITH DIFFERENTIAL/PLATELET
Abs Immature Granulocytes: 0.02 10*3/uL (ref 0.00–0.07)
Basophils Absolute: 0 10*3/uL (ref 0.0–0.1)
Basophils Relative: 1 %
Eosinophils Absolute: 0 10*3/uL (ref 0.0–0.5)
Eosinophils Relative: 1 %
HCT: 38.8 % (ref 36.0–46.0)
Hemoglobin: 12.8 g/dL (ref 12.0–15.0)
Immature Granulocytes: 0 %
Lymphocytes Relative: 34 %
Lymphs Abs: 2.4 10*3/uL (ref 0.7–4.0)
MCH: 30.1 pg (ref 26.0–34.0)
MCHC: 33 g/dL (ref 30.0–36.0)
MCV: 91.3 fL (ref 80.0–100.0)
Monocytes Absolute: 0.4 10*3/uL (ref 0.1–1.0)
Monocytes Relative: 6 %
Neutro Abs: 4.3 10*3/uL (ref 1.7–7.7)
Neutrophils Relative %: 58 %
Platelets: 193 10*3/uL (ref 150–400)
RBC: 4.25 MIL/uL (ref 3.87–5.11)
RDW: 14 % (ref 11.5–15.5)
WBC: 7.2 10*3/uL (ref 4.0–10.5)
nRBC: 0 % (ref 0.0–0.2)

## 2021-12-11 LAB — BASIC METABOLIC PANEL
Anion gap: 7 (ref 5–15)
BUN: 18 mg/dL (ref 8–23)
CO2: 28 mmol/L (ref 22–32)
Calcium: 9.8 mg/dL (ref 8.9–10.3)
Chloride: 106 mmol/L (ref 98–111)
Creatinine, Ser: 0.83 mg/dL (ref 0.44–1.00)
GFR, Estimated: >60 mL/min (ref >60)
Glucose, Bld: 199 mg/dL — ABNORMAL HIGH (ref 70–99)
Potassium: 3.9 mmol/L (ref 3.5–5.1)
Sodium: 141 mmol/L (ref 135–145)

## 2021-12-11 MED ORDER — MECLIZINE HCL 12.5 MG PO TABS
12.5000 mg | ORAL_TABLET | Freq: Three times a day (TID) | ORAL | 0 refills | Status: AC | PRN
Start: 2021-12-11 — End: ?

## 2021-12-11 MED ORDER — CIPROFLOXACIN-DEXAMETHASONE 0.3-0.1 % OT SUSP
4.0000 [drp] | Freq: Two times a day (BID) | OTIC | 0 refills | Status: AC
Start: 2021-12-11 — End: 2021-12-18

## 2021-12-11 NOTE — ED Triage Notes (Signed)
Pt c/o pain to left ear and dizziness x 2 days.

## 2021-12-11 NOTE — ED Provider Notes (Signed)
[facility]-EMERGENCY DEPT Provider Note   CSN: [id number] Arrival date & time: 12/11/21  1026     History  Chief Complaint  Patient presents with   Otalgia   Dizziness    Barbara Grant is a 76 y.o. female with a PMHx of DM,  HTN, who presents to the ED complaining of left ear pain onset 3 months worsening 2 days. She notes that she used something to clean her ears and something broke off into her ear. It was removed by an urgent care doctor approximately 3 months. Has associated dizziness intermittently x 4 days.  She notes that her dizziness is worse with position changes and looking up at the ceiling.  Has a pulsating sensation noted to bilateral eyes (ongoing x this morning, woke up like that this morning). No meds tried PTA. Denies ear drainage, fever, blurred vision, double vision, headache, LOC, palpitations. Denies history of elevated cholesterol, CVA, TIA, MI.    The history is provided by the patient. A language interpreter was used (spanish).       Home Medications Prior to Admission medications   Medication Sig Start Date End Date Taking? Authorizing Provider  alendronate (FOSAMAX) 70 MG tablet Take 1 tablet (70 mg total) by mouth every 7 (seven) days. Take with a full glass of water on an empty stomach. 12/14/12   Copland, Gwenlyn Found, MD  atorvastatin (LIPITOR) 40 MG tablet Take 40 mg by mouth daily. 04/24/17   [provider]  bisoprolol-hydrochlorothiazide (ZIAC) 5-6.25 MG per tablet Take 1 tablet by mouth daily. Patient not taking: Reported on 07/27/2017 12/14/12   Copland, Gwenlyn Found, MD  cyclobenzaprine (FLEXERIL) 10 MG tablet Take 1 tablet (10 mg total) by mouth 2 (two) times daily as needed for muscle spasms. Patient not taking: Reported on 07/27/2017 04/30/17   Alvira Monday, MD  furosemide (LASIX) 20 MG tablet Take 20 mg by mouth daily. 04/29/17   [provider]  gabapentin (NEURONTIN) 100 MG capsule Take 1 capsule (100 mg  total) by mouth 3 (three) times daily. 09/01/17 10/01/17  Alvira Monday, MD  glipiZIDE (GLUCOTROL) 10 MG tablet Take 10 mg by mouth 2 (two) times daily before a meal.  03/02/17   [provider]  glucose blood (CHOICE DM FORA G20 TEST STRIPS) test strip Use as instructed 12/20/12   Nelva Nay, PA-C  Lancets MISC To check blood sugar tid dx code 250.00 accu check meter 12/20/12   Rhoderick Moody M, PA-C  lisinopril (PRINIVIL,ZESTRIL) 5 MG tablet Take 1 tablet (5 mg total) by mouth daily. Patient not taking: Reported on 07/27/2017 12/14/12   Copland, Gwenlyn Found, MD  losartan (COZAAR) 100 MG tablet Take 100 mg by mouth daily. 03/02/17   [provider]  meclizine (ANTIVERT) 12.5 MG tablet Take 1-2 tablets (12.5-25 mg total) by mouth 3 (three) times daily as needed for dizziness. 07/16/20   Linwood Dibbles, MD  meloxicam (MOBIC) 7.5 MG tablet Take 7.5 mg by mouth daily as needed for pain.    [provider]  metFORMIN (GLUCOPHAGE) 500 MG tablet Take 1 tablet (500 mg total) by mouth daily with breakfast. 06/11/19   Sharman Cheek, MD  omeprazole (PRILOSEC) 20 MG capsule Take 2 capsules (40 mg total) by mouth daily for 14 days. 05/07/21 05/21/21  Alvira Monday, MD  ondansetron (ZOFRAN ODT) 4 MG disintegrating tablet Take 1 tablet (4 mg total) by mouth every 8 (eight) hours as needed for nausea or vomiting. 06/11/19   Scotty Court,  Aneta Mins, MD      Allergies    Patient has no known allergies.    Review of Systems   Review of Systems  Constitutional:  Negative for fever.  HENT:  Positive for ear pain.   Eyes:  Negative for photophobia and visual disturbance.  Cardiovascular:  Negative for palpitations.  Neurological:  Positive for dizziness. Negative for syncope, light-headedness and headaches.  All other systems reviewed and are negative.   Physical Exam Updated Vital Signs BP 118/77 (BP Location: Left Arm)   Pulse 73   Temp 98.2 F (36.8 C) (Oral)   Resp 16   Ht 5\' 6"   (1.676 m)   Wt 67.1 kg   SpO2 100%   BMI 23.89 kg/m  Physical Exam Vitals and nursing note reviewed.  Constitutional:      General: She is not in acute distress.    Appearance: She is not diaphoretic.  HENT:     Head: Normocephalic and atraumatic.     Right Ear: Tympanic membrane normal. Tenderness present. No drainage. No foreign body. No mastoid tenderness. No hemotympanum. Tympanic membrane is not injected, scarred, perforated, erythematous, retracted or bulging.     Left Ear: Tympanic membrane, ear canal and external ear normal. No drainage or tenderness. No foreign body. No mastoid tenderness. No hemotympanum. Tympanic membrane is not injected, scarred, perforated, erythematous, retracted or bulging.     Mouth/Throat:     Pharynx: No oropharyngeal exudate.  Eyes:     General: No scleral icterus.    Conjunctiva/sclera: Conjunctivae normal.  Cardiovascular:     Rate and Rhythm: Normal rate and regular rhythm.     Pulses: Normal pulses.     Heart sounds: Normal heart sounds.  Pulmonary:     Effort: Pulmonary effort is normal. No respiratory distress.     Breath sounds: Normal breath sounds. No wheezing.  Abdominal:     General: Bowel sounds are normal.     Palpations: Abdomen is soft. There is no mass.     Tenderness: There is no abdominal tenderness. There is no guarding or rebound.  Musculoskeletal:        General: Normal range of motion.     Cervical back: Normal range of motion and neck supple.  Skin:    General: Skin is warm and dry.  Neurological:     General: No focal deficit present.     Mental Status: She is alert.     Cranial Nerves: Cranial nerves 2-12 are intact.     Sensory: Sensation is intact.     Motor: Motor function is intact. No pronator drift.     Coordination: Coordination is intact.  Psychiatric:        Behavior: Behavior normal.     ED Results / Procedures / Treatments   Labs (all labs ordered are listed, but only abnormal results are  displayed) Labs Reviewed  BASIC METABOLIC PANEL - Abnormal; Notable for the following components:      Result Value   Glucose, Bld 199 (*)    All other components within normal limits  CBC WITH DIFFERENTIAL/PLATELET    EKG None  Radiology No results found.  Procedures Procedures    Medications Ordered in ED Medications - No data to display  ED Course/ Medical Decision Making/ A&P Clinical Course as of 12/11/21 2206  Wed Dec 11, 2021  1705 Discussed with patient discharge treatment plan.  Answered all available questions.  Patient appears safe for discharge at this time. [SB]  Clinical Course User Index [SB] Darey Hershberger A, PA-C                           Medical Decision Making Risk Prescription drug management.   Pt presents with left ear pain and dizziness onset 2 days.  Patient afebrile.  On exam patient with no focal neurological deficits.  Negative pronator drift.  No acute cardiovascular respiratory exam findings.  TMs visualized bilaterally without abnormality.  Mild tenderness to palpation noted to right external ear and canal with slight edema. Differential diagnosis includes otitis media, otitis externa, mastoiditis, vertigo.    Co morbidities that complicate the patient evaluation: Diabetes Hypertension  Additional history obtained:  External records from outside source obtained and reviewed including: Patient had an MRI of the brain with and without completed in 08/15/2020 that found no intracranial abnormality.  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  Labs:  I ordered, and personally interpreted labs.  The pertinent results include:   BMP Elevated glucose at 199 otherwise unremarkable. CBC unremarkable  Disposition: Pt presentation suspicious for otitis externa and vertigo.  Doubt intracranial abnormality at this time.  Doubt otitis media or mastoiditis at this time.  Case discussed with attending who evaluated patient and agrees with discharge  treatment plan at this time.  After consideration of the diagnostic results and the patients response to treatment, I feel that the patient would benefit from Discharge home.  Patient will be discharged home with a prescription for Ciprodex and Antivert.  Also instructed patient to follow-up with her primary care provider as needed.  Patient provided with information for on-call neurologist to call and set up a follow-up appoint regarding today's ED visit.  Supportive care measures and strict return precautions discussed with patient at bedside. Pt acknowledges and verbalizes understanding. Pt appears safe for discharge. Follow up as indicated in discharge paperwork.    This chart was dictated using voice recognition software, Dragon. Despite the best efforts of this provider to proofread and correct errors, errors may still occur which can change documentation meaning.   Final Clinical Impression(s) / ED Diagnoses Final diagnoses:  Dizziness  Acute otitis externa of left ear, unspecified type    Rx / DC Orders ED Discharge Orders          Ordered    ciprofloxacin-dexamethasone (CIPRODEX) OTIC suspension  2 times daily        12/11/21 1700    meclizine (ANTIVERT) 12.5 MG tablet  3 times daily PRN        12/11/21 1700              Britney Newstrom A, PA-C 12/11/21 2207    Arby Barrette, MD 12/15/21 1136

## 2021-12-11 NOTE — Discharge Instructions (Addendum)
It was a pleasure taking care of you today!   You will be sent a prescription for Meclizine and ciprodex. Take these medications as prescribed.  Attached is information for the on-call neurologist, call today and set up a follow-up appointment regarding today's ED visit.  You may follow with your primary care provider as needed.  Return to the emergency department for experiencing increasing/worsening dizziness, trouble walking, passing out, worsening symptoms.

## 2021-12-11 NOTE — ED Provider Triage Note (Signed)
Emergency Medicine Provider Triage Evaluation Note  Barbara Grant , a 76 y.o. female  was evaluated in triage.  Pt complains of left ear pain, dizziness, as well as pulsating vision in the left eye.  Ongoing for 2 days.   Review of Systems  Positive: As above Negative: As above  Physical Exam  BP 125/76 (BP Location: Right Arm)   Pulse 98   Temp 98.3 F (36.8 C) (Oral)   Resp 16   Ht 5\' 6"  (1.676 m)   Wt 67.1 kg   SpO2 98%   BMI 23.89 kg/m  Gen:   Awake, no distress   Resp:  Normal effort  MSK:   Moves extremities without difficulty  Other:  Cranial nerves III through XII intact.  Full range of motion bilateral upper and lower extremities.  Strength 5/5 and symmetrical in bilateral upper and lower extremities.  Medical Decision Making  Medically screening exam initiated at 10:55 AM.  Appropriate orders placed.  Barbara Grant was informed that the remainder of the evaluation will be completed by another provider, this initial triage assessment does not replace that evaluation, and the importance of remaining in the ED until their evaluation is complete.     Barbara Jacobs, PA-C 12/11/21 1056

## 2022-06-24 ENCOUNTER — Institutional Professional Consult (permissible substitution): Payer: Medicare Other | Admitting: Plastic Surgery

## 2022-07-01 ENCOUNTER — Institutional Professional Consult (permissible substitution): Payer: 59 | Admitting: Plastic Surgery

## 2022-07-21 IMAGING — US US ABDOMEN LIMITED
1 series · 15 of 25 positions shown · non-contrast
Comparison: CT abdomen pelvis dated 09/04/2011.

CLINICAL DATA: Right upper quadrant abdominal pain.

EXAM:
ULTRASOUND ABDOMEN LIMITED RIGHT UPPER QUADRANT

[Series 1: us abdomen limited ruq mc & wl · 15 of 39 slices shown]
[im 1/39]
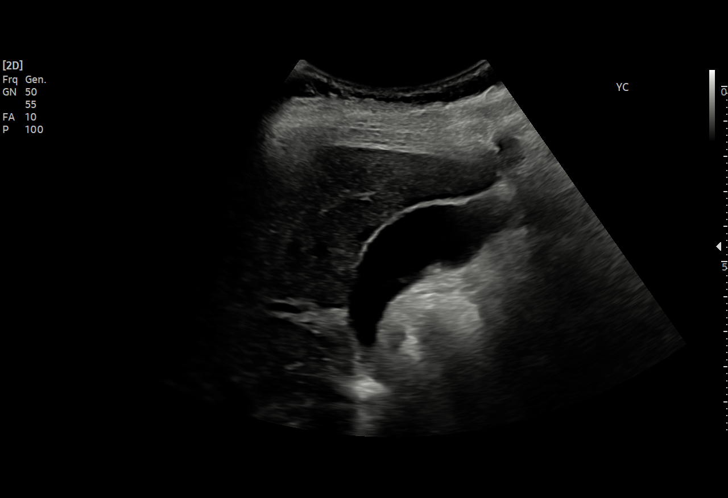
[im 4/39]
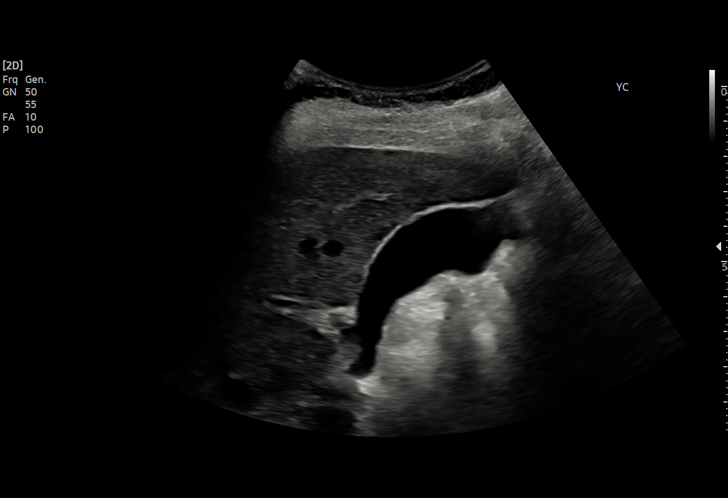
[im 7/39]
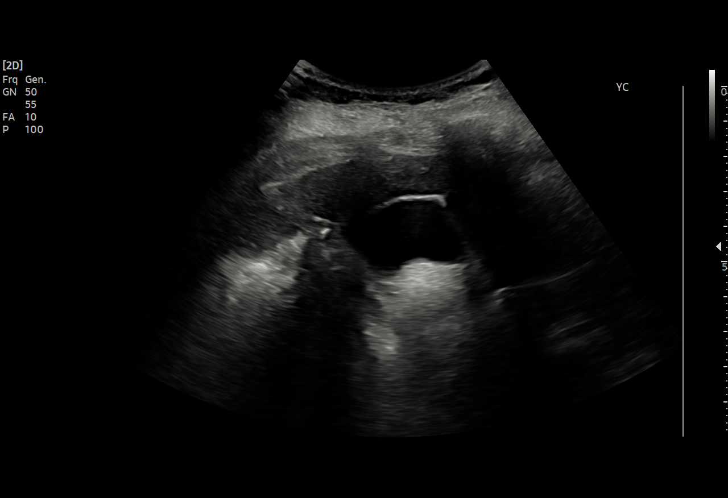
[im 8/39]
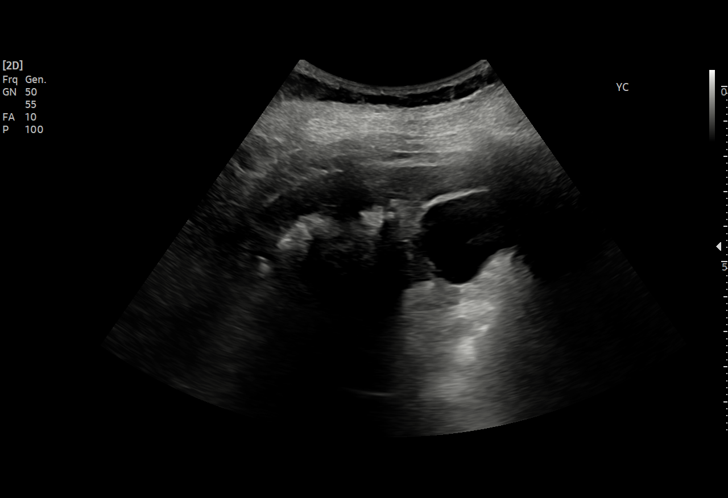
[im 12/39]
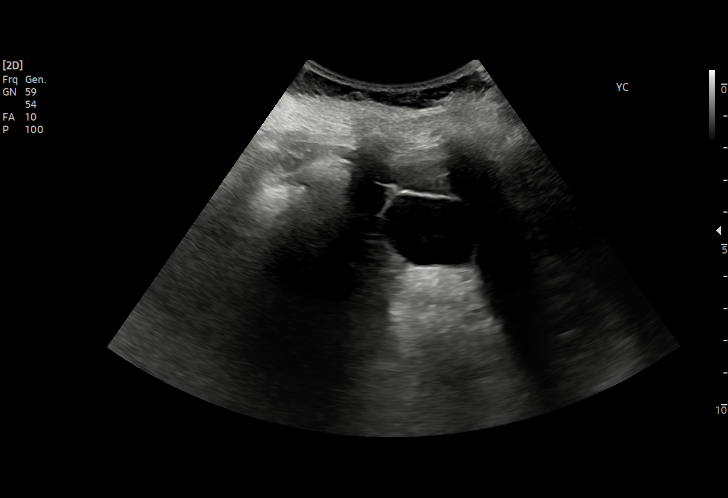
[im 15/39]
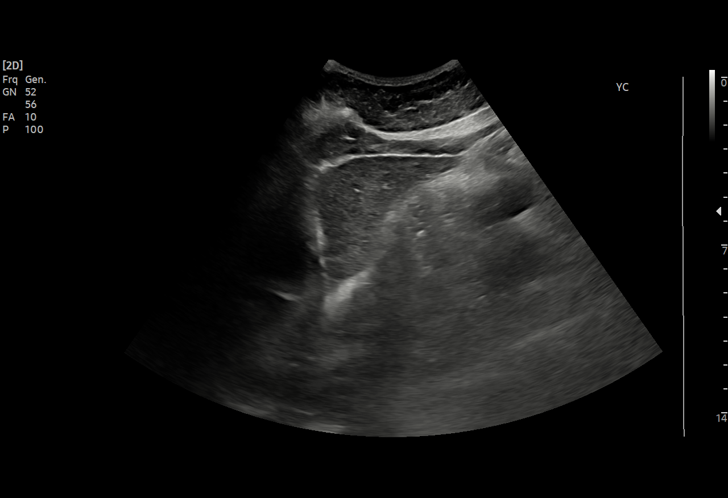
[im 16/39]
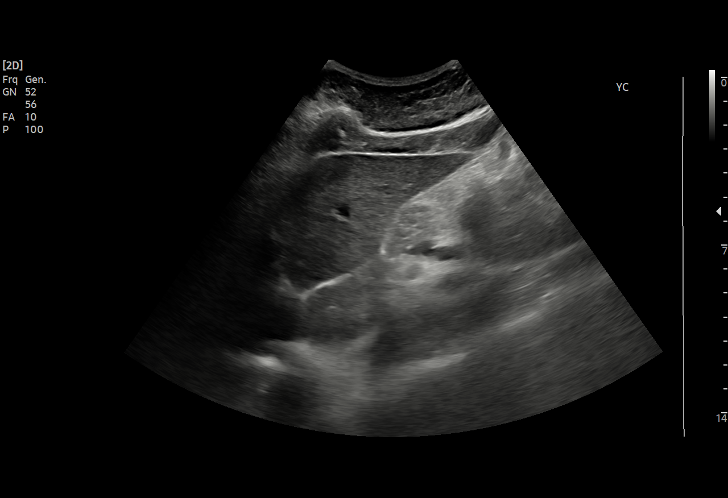
[im 20/39]
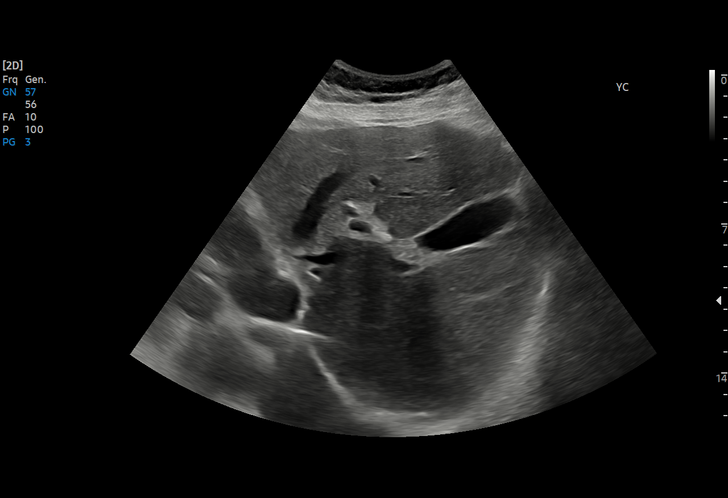
[im 23/39]
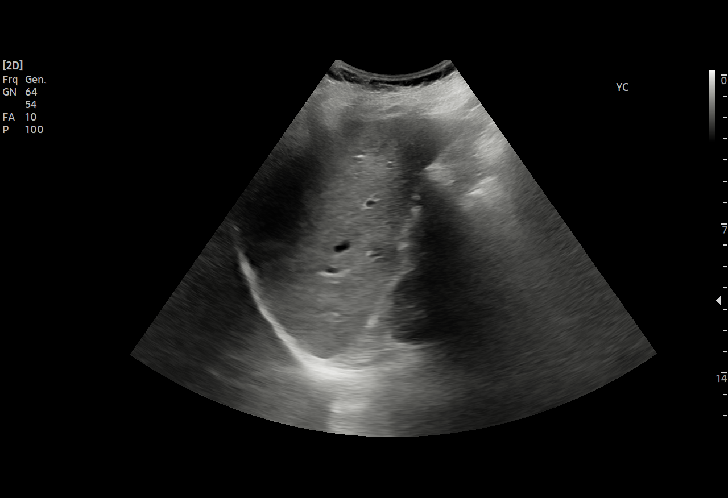
[im 24/39]
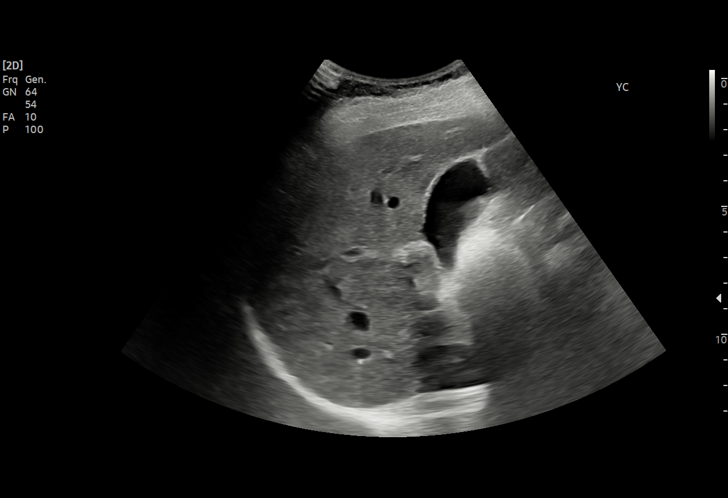
[im 27/39]
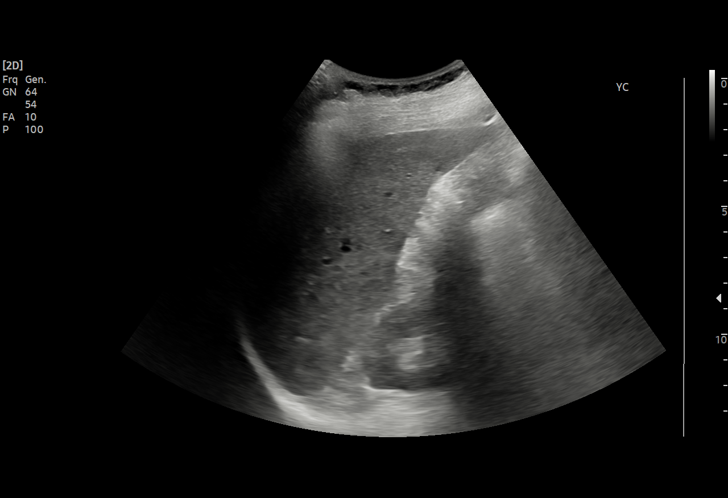
[im 31/39]
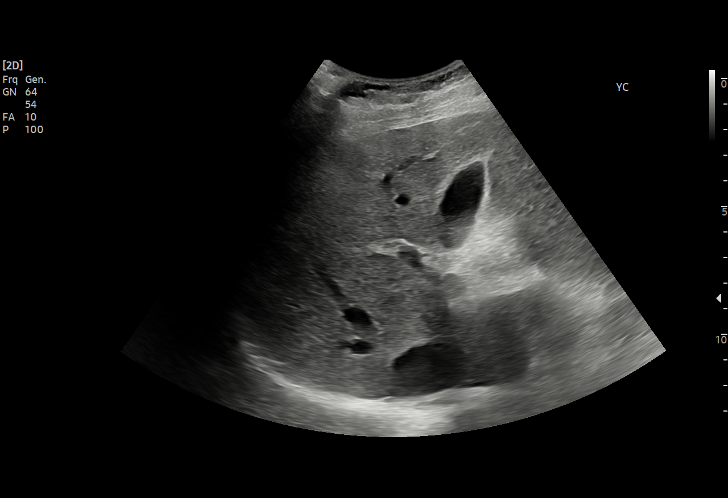
[im 32/39]
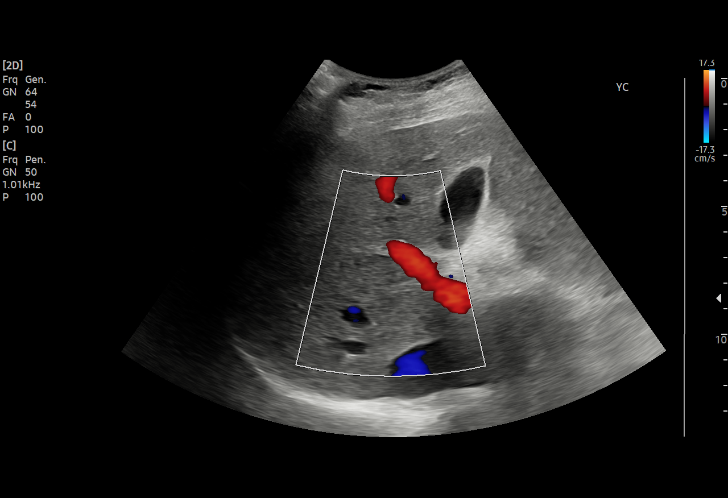
[im 35/39]
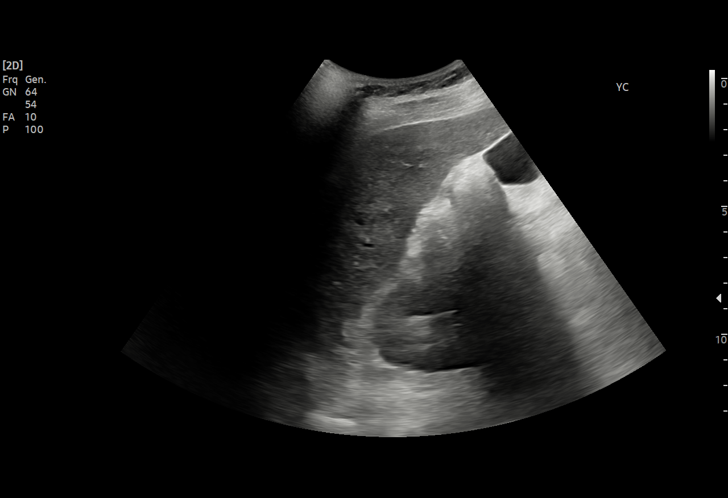
[im 39/39]
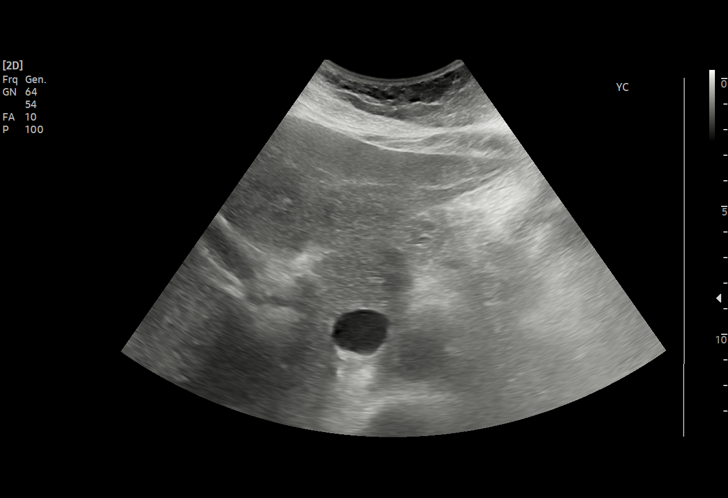

[15 of 25 positions shown; findings below may reference images not displayed]

FINDINGS: Gallbladder:

No gallstones or wall thickening visualized. No sonographic Murphy
sign noted by sonographer.

Common bile duct:

Diameter: 3 mm

Liver:

No focal lesion identified. Within normal limits in parenchymal
echogenicity. Portal vein is patent on color Doppler imaging with
normal direction of blood flow towards the liver.

Other: None.
IMPRESSION: Unremarkable right upper quadrant ultrasound.

## 2022-08-15 ENCOUNTER — Emergency Department (HOSPITAL_COMMUNITY): Payer: 59

## 2022-08-15 ENCOUNTER — Emergency Department (HOSPITAL_COMMUNITY)
Admission: EM | Admit: 2022-08-15 | Discharge: 2022-08-15 | Disposition: A | Discharge status: Home / Self Care | Payer: 59 | Attending: Student | Admitting: Student

## 2022-08-15 ENCOUNTER — Encounter (HOSPITAL_COMMUNITY): Payer: Self-pay

## 2022-08-15 DIAGNOSIS — W01198A Fall on same level from slipping, tripping and stumbling with subsequent striking against other object, initial encounter: Secondary | ICD-10-CM | POA: Diagnosis not present

## 2022-08-15 DIAGNOSIS — G8911 Acute pain due to trauma: Secondary | ICD-10-CM | POA: Insufficient documentation

## 2022-08-15 DIAGNOSIS — M542 Cervicalgia: Secondary | ICD-10-CM | POA: Diagnosis not present

## 2022-08-15 DIAGNOSIS — M533 Sacrococcygeal disorders, not elsewhere classified: Secondary | ICD-10-CM | POA: Insufficient documentation

## 2022-08-15 DIAGNOSIS — S0990XA Unspecified injury of head, initial encounter: Secondary | ICD-10-CM | POA: Diagnosis present

## 2022-08-15 DIAGNOSIS — W19XXXA Unspecified fall, initial encounter: Secondary | ICD-10-CM

## 2022-08-15 DIAGNOSIS — M545 Low back pain, unspecified: Secondary | ICD-10-CM | POA: Insufficient documentation

## 2022-08-15 NOTE — ED Notes (Signed)
Pt transported to CT ?

## 2022-08-15 NOTE — ED Triage Notes (Signed)
Pt arrived via POV, c/o mechanical fall Wednesday. No LOC, no thinners. C/o back of head, neck and coccyx pain.

## 2022-08-15 NOTE — Discharge Instructions (Signed)
It was a pleasure taking care of you today!   Your CT scans didn't show any emergent concerning findings today.  Call your primary care provider to set up a follow-up appointment regarding today's ED visit.  You may take over-the-counter 600 mg ibuprofen every 6 hours and alternate with 500 mg Tylenol every 6 hours as needed for pain for more than 7 days.  You may place ice or heat to the affected area up to 15 minutes at a time, ensure to place a barrier between your skin and the ice/heat.  Return to the emergency department if you are experiencing loss of bowel/bladder, increasing/worsening symptoms, fever, inability to walk.  Fue un placer cuidarte hoy!  Sus tomografas computarizadas no mostraron Publishing rights manager. Llame a su proveedor de atencin primaria para programar una cita de seguimiento con respecto a la visita al servicio de urgencias de hoy. Puede tomar 600 mg de ibuprofeno de venta libre cada 6 horas y Counsellor con 500 mg de Tylenol cada 6 horas segn sea necesario para el dolor durante ms de 7 das. Puede colocar hielo o calor en el rea afectada hasta por 15 minutos a la vez; asegrese de Engineer, production barrera entre su piel y Teacher, adult education. Regrese al departamento de emergencias si experimenta prdida de intestino/vejiga, sntomas que [place] o empeoran, fiebre o incapacidad para caminar.

## 2022-08-15 NOTE — ED Provider Notes (Signed)
[facility] Provider Note   CSN: [id number] Arrival date & time: 08/15/22  1453     History  Chief Complaint  Patient presents with   Fall    Barbara Grant is a 77 y.o. female who presents to the ED with concerns for fall onset 2 days. Notes that it was a mechanical fall where she hit her head and landed on her bottom. Has posterior head pain, neck pain, lower back pain. Tried advil with relief of her symptoms. Denies LOC, nausea, vomiting, abdominal pain, chest pain, shortness of breath, dizziness, lightheadedness, numbness, tingling, weakness, bowel/bladder incontinence.   The history is provided by the patient. No language interpreter was used.       Home Medications Prior to Admission medications   Medication Sig Start Date End Date Taking? Authorizing Provider  alendronate (FOSAMAX) 70 MG tablet Take 1 tablet (70 mg total) by mouth every 7 (seven) days. Take with a full glass of water on an empty stomach. 12/14/12   Copland, Gay Filler, MD  atorvastatin (LIPITOR) 40 MG tablet Take 40 mg by mouth daily. 04/24/17   [provider]  bisoprolol-hydrochlorothiazide (ZIAC) 5-6.25 MG per tablet Take 1 tablet by mouth daily. Patient not taking: Reported on 07/27/2017 12/14/12   Copland, Gay Filler, MD  cyclobenzaprine (FLEXERIL) 10 MG tablet Take 1 tablet (10 mg total) by mouth 2 (two) times daily as needed for muscle spasms. Patient not taking: Reported on 07/27/2017 04/30/17   Gareth Morgan, MD  furosemide (LASIX) 20 MG tablet Take 20 mg by mouth daily. 04/29/17   [provider]  gabapentin (NEURONTIN) 100 MG capsule Take 1 capsule (100 mg total) by mouth 3 (three) times daily. 09/01/17 10/01/17  Gareth Morgan, MD  glipiZIDE (GLUCOTROL) 10 MG tablet Take 10 mg by mouth 2 (two) times daily before a meal.  03/02/17   [provider]  glucose blood (CHOICE DM FORA G20 TEST STRIPS) test strip Use as instructed 12/20/12    Collene Leyden, PA-C  Lancets MISC To check blood sugar tid dx code 250.00 accu check meter 12/20/12   Georgiann Mccoy M, PA-C  lisinopril (PRINIVIL,ZESTRIL) 5 MG tablet Take 1 tablet (5 mg total) by mouth daily. Patient not taking: Reported on 07/27/2017 12/14/12   Copland, Gay Filler, MD  losartan (COZAAR) 100 MG tablet Take 100 mg by mouth daily. 03/02/17   [provider]  meclizine (ANTIVERT) 12.5 MG tablet Take 1-2 tablets (12.5-25 mg total) by mouth 3 (three) times daily as needed for dizziness. 12/11/21   Symantha Steeber A, PA-C  meloxicam (MOBIC) 7.5 MG tablet Take 7.5 mg by mouth daily as needed for pain.    [provider]  metFORMIN (GLUCOPHAGE) 500 MG tablet Take 1 tablet (500 mg total) by mouth daily with breakfast. 06/11/19   Carrie Mew, MD  omeprazole (PRILOSEC) 20 MG capsule Take 2 capsules (40 mg total) by mouth daily for 14 days. 05/07/21 05/21/21  Gareth Morgan, MD  ondansetron (ZOFRAN ODT) 4 MG disintegrating tablet Take 1 tablet (4 mg total) by mouth every 8 (eight) hours as needed for nausea or vomiting. 06/11/19   Carrie Mew, MD      Allergies    Patient has no known allergies.    Review of Systems   Review of Systems  All other systems reviewed and are negative.   Physical Exam Updated Vital Signs BP (!) 167/107 (BP Location: Left Arm)   Pulse 85  Temp 98.3 F (36.8 C) (Oral)   Resp 18   SpO2 100%  Physical Exam Vitals and nursing note reviewed.  Constitutional:      General: She is not in acute distress.    Appearance: She is not diaphoretic.  HENT:     Head: Normocephalic and atraumatic.     Mouth/Throat:     Pharynx: No oropharyngeal exudate.  Eyes:     General: No scleral icterus.    Conjunctiva/sclera: Conjunctivae normal.  Cardiovascular:     Rate and Rhythm: Normal rate and regular rhythm.     Pulses: Normal pulses.     Heart sounds: Normal heart sounds.  Pulmonary:     Effort: Pulmonary effort is normal. No  respiratory distress.     Breath sounds: Normal breath sounds. No wheezing.  Abdominal:     General: Bowel sounds are normal.     Palpations: Abdomen is soft. There is no mass.     Tenderness: There is no abdominal tenderness. There is no guarding or rebound.  Musculoskeletal:        General: Normal range of motion.     Cervical back: Normal range of motion and neck supple.     Comments: Able to ambulate without assistance or difficulty. No TTP noted to cervical or thoracic spine. TTP noted to lower lumbar/sacral spine. TTP noted to left musculature of the lumbo-sacral region. Strength and sensation intact to BLE.   Skin:    General: Skin is warm and dry.  Neurological:     Mental Status: She is alert.  Psychiatric:        Behavior: Behavior normal.    ED Results / Procedures / Treatments   Labs (all labs ordered are listed, but only abnormal results are displayed) Labs Reviewed - No data to display  EKG None  Radiology CT Lumbar Spine Wo Contrast  Result Date: 08/15/2022 CLINICAL DATA:  Trauma EXAM: CT LUMBAR SPINE WITHOUT CONTRAST TECHNIQUE: Multidetector CT imaging of the lumbar spine was performed without intravenous contrast administration. Multiplanar CT image reconstructions were also generated. RADIATION DOSE REDUCTION: This exam was performed according to the departmental dose-optimization program which includes automated exposure control, adjustment of the mA and/or kV according to patient size and/or use of iterative reconstruction technique. COMPARISON:  None Available. FINDINGS: Segmentation: 5 lumbar type vertebrae. Alignment: Normal. Vertebrae: No acute fracture or focal pathologic process. Osteopenic. Paraspinal and other soft tissues: Negative. Disc levels: Maintained IMPRESSION: Osteopenia.  No acute traumatic abnormalities. Electronically Signed   By: Sammie Bench M.D.   On: 08/15/2022 20:24   CT Head Wo Contrast  Result Date: 08/15/2022 CLINICAL DATA:  Trauma.  EXAM: CT HEAD WITHOUT CONTRAST TECHNIQUE: Contiguous axial images were obtained from the base of the skull through the vertex without intravenous contrast. RADIATION DOSE REDUCTION: This exam was performed according to the departmental dose-optimization program which includes automated exposure control, adjustment of the mA and/or kV according to patient size and/or use of iterative reconstruction technique. COMPARISON:  07/27/2017 FINDINGS: Brain: There is periventricular white matter decreased attenuation consistent with small vessel ischemic changes. Ventricles, sulci and cisterns are prominent consistent with age related involutional changes. No acute intracranial hemorrhage, mass effect or shift. No hydrocephalus. Vascular: No hyperdense vessel or unexpected calcification. Skull: Normal. Negative for fracture or focal lesion. Sinuses/Orbits: No acute finding. IMPRESSION: Atrophy and chronic small vessel ischemic changes. No acute intracranial process identified. EXAM: CT CERVICAL SPINE WITHOUT CONTRAST TECHNIQUE: Multidetector CT imaging of the cervical spine was  performed without intravenous contrast. Multiplanar CT image reconstructions were also generated. RADIATION DOSE REDUCTION: This exam was performed according to the departmental dose-optimization program which includes automated exposure control, adjustment of the mA and/or kV according to patient size and/or use of iterative reconstruction technique. COMPARISON:  None Available. FINDINGS: Alignment: Normal. Skull base and vertebrae: No acute fracture. No primary bone lesion or focal pathologic process. Soft tissues and spinal canal: No prevertebral fluid or swelling. No visible canal hematoma. Disc levels: Marginal osteophyte formation with disc space narrowing and anterior and posterior projecting osteophytes C4-C7. Osteoarthritis C1-C2. Upper chest: Negative. IMPRESSION: 1. Degenerative disc disease. 2. No acute traumatic abnormalities of the  cervical spine. Electronically Signed   By: Sammie Bench M.D.   On: 08/15/2022 19:30   CT Cervical Spine Wo Contrast  Result Date: 08/15/2022 CLINICAL DATA:  Trauma. EXAM: CT HEAD WITHOUT CONTRAST TECHNIQUE: Contiguous axial images were obtained from the base of the skull through the vertex without intravenous contrast. RADIATION DOSE REDUCTION: This exam was performed according to the departmental dose-optimization program which includes automated exposure control, adjustment of the mA and/or kV according to patient size and/or use of iterative reconstruction technique. COMPARISON:  07/27/2017 FINDINGS: Brain: There is periventricular white matter decreased attenuation consistent with small vessel ischemic changes. Ventricles, sulci and cisterns are prominent consistent with age related involutional changes. No acute intracranial hemorrhage, mass effect or shift. No hydrocephalus. Vascular: No hyperdense vessel or unexpected calcification. Skull: Normal. Negative for fracture or focal lesion. Sinuses/Orbits: No acute finding. IMPRESSION: Atrophy and chronic small vessel ischemic changes. No acute intracranial process identified. EXAM: CT CERVICAL SPINE WITHOUT CONTRAST TECHNIQUE: Multidetector CT imaging of the cervical spine was performed without intravenous contrast. Multiplanar CT image reconstructions were also generated. RADIATION DOSE REDUCTION: This exam was performed according to the departmental dose-optimization program which includes automated exposure control, adjustment of the mA and/or kV according to patient size and/or use of iterative reconstruction technique. COMPARISON:  None Available. FINDINGS: Alignment: Normal. Skull base and vertebrae: No acute fracture. No primary bone lesion or focal pathologic process. Soft tissues and spinal canal: No prevertebral fluid or swelling. No visible canal hematoma. Disc levels: Marginal osteophyte formation with disc space narrowing and anterior and  posterior projecting osteophytes C4-C7. Osteoarthritis C1-C2. Upper chest: Negative. IMPRESSION: 1. Degenerative disc disease. 2. No acute traumatic abnormalities of the cervical spine. Electronically Signed   By: Sammie Bench M.D.   On: 08/15/2022 19:30    Procedures Procedures    Medications Ordered in ED Medications - No data to display  ED Course/ Medical Decision Making/ A&P Clinical Course as of 08/15/22 2048  Fri Aug 15, 2022  2043 Discussed with patient imaging findings and discharge treatment plan.  Answered all available questions.  Patient appears safe for discharge at this time. [SB]    Clinical Course User Index [SB] Tavaris Eudy A, PA-C                             Medical Decision Making Amount and/or Complexity of Data Reviewed Radiology: ordered.   Pt with fall occurring 2 days ago. Pt afebrile. On exam, patient with Able to ambulate without assistance or difficulty. No TTP noted to cervical or thoracic spine. TTP noted to lower lumbar/sacral spine. TTP noted to left musculature of the lumbo-sacral region. Strength and sensation intact to BLE. No acute cardiovascular, respiratory, or abdominal exam findings. Differential diagnosis includes fracture, dislocation, herniation, cauda  equina.   Imaging: I ordered imaging studies including CT head, cervical, lumbar I independently visualized and interpreted imaging which showed: No acute findings I agree with the radiologist interpretation   Disposition: Presenting suspicious for fall and head injury.  Doubt concerns at this time for fracture, dislocation, herniation, cauda equina. After consideration of the diagnostic results and the patients response to treatment, I feel that the patient would benefit from Discharge home.  Patient instructed to follow-up with primary care provider regarding today's ED visit.  Supportive care measures and strict return cautions discussed with patient at bedside. Appears safe for  discharge at this time. Follow up as indicated in discharge paperwork.   This chart was dictated using voice recognition software, Dragon. Despite the best efforts of this provider to proofread and correct errors, errors may still occur which can change documentation meaning.   Final Clinical Impression(s) / ED Diagnoses Final diagnoses:  Fall, initial encounter  Injury of head, initial encounter  Acute midline low back pain without sciatica    Rx / DC Orders ED Discharge Orders     None         Heaven Wandell A, PA-C 08/15/22 2155    Teressa Lower, MD 08/17/22 1315

## 2022-10-03 ENCOUNTER — Ambulatory Visit (INDEPENDENT_AMBULATORY_CARE_PROVIDER_SITE_OTHER): Payer: 59 | Admitting: Plastic Surgery

## 2022-10-03 ENCOUNTER — Encounter: Payer: Self-pay | Admitting: Plastic Surgery

## 2022-10-03 ENCOUNTER — Institutional Professional Consult (permissible substitution): Payer: 59 | Admitting: Plastic Surgery

## 2022-10-03 VITALS — BP 123/76 | HR 93 | Ht 65.0 in | Wt 160.8 lb

## 2022-10-03 DIAGNOSIS — H02834 Dermatochalasis of left upper eyelid: Secondary | ICD-10-CM | POA: Diagnosis not present

## 2022-10-03 DIAGNOSIS — H02831 Dermatochalasis of right upper eyelid: Secondary | ICD-10-CM | POA: Diagnosis not present

## 2022-10-03 DIAGNOSIS — H02839 Dermatochalasis of unspecified eye, unspecified eyelid: Secondary | ICD-10-CM | POA: Insufficient documentation

## 2022-10-03 NOTE — Progress Notes (Signed)
Patient ID: Barbara Grant, female    DOB: 03/06/1946, 77 y.o.   MRN: [id number]   Chief Complaint  Patient presents with   consult    The patient is a 77 year old female here for evaluation of her eyelids.  Her past medical history is positive for diabetes, hypertension, arthritis and reflux.  She has had a hernia repair in the past.  She is not a smoker.  Her last hemoglobin A1c was November 2023 and was 7.2.  She is 5 feet 5 inches tall weighs 160 pounds.  She notices that its become more difficult to have full vision by the late afternoon and evening due to the hooding of her upper lid skin.  When we taped her lids for the pictures she immediately could tell the difference in the improvement in her visual field.  This is gotten worse over the last several months and is certainly worse by the end of the day.  She asked about the lower lids but these would not be covered by insurance.    Review of Systems  Constitutional: Negative.   HENT: Negative.    Eyes: Negative.   Respiratory: Negative.  Negative for chest tightness and shortness of breath.   Cardiovascular: Negative.   Gastrointestinal: Negative.   Endocrine: Negative.   Genitourinary: Negative.   Musculoskeletal: Negative.     Past Medical History:  Diagnosis Date   Arthritis    Diabetes mellitus    Diabetes mellitus without complication (HCC)    GERD (gastroesophageal reflux disease)    HTN (hypertension)    Hypertension    Osteoporosis     Past Surgical History:  Procedure Laterality Date   HERNIA REPAIR        Current Outpatient Medications:    alendronate (FOSAMAX) 70 MG tablet, Take 1 tablet (70 mg total) by mouth every 7 (seven) days. Take with a full glass of water on an empty stomach., Disp: 4 tablet, Rfl: 5   atorvastatin (LIPITOR) 40 MG tablet, Take 40 mg by mouth daily., Disp: , Rfl:    bisoprolol-hydrochlorothiazide (ZIAC) 5-6.25 MG per tablet, Take 1 tablet by mouth daily., Disp: 30 tablet, Rfl: 5    cyclobenzaprine (FLEXERIL) 10 MG tablet, Take 1 tablet (10 mg total) by mouth 2 (two) times daily as needed for muscle spasms., Disp: 20 tablet, Rfl: 0   furosemide (LASIX) 20 MG tablet, Take 20 mg by mouth daily., Disp: , Rfl:    glipiZIDE (GLUCOTROL) 10 MG tablet, Take 10 mg by mouth 2 (two) times daily before a meal. , Disp: , Rfl: 3   glucose blood (CHOICE DM FORA G20 TEST STRIPS) test strip, Use as instructed, Disp: 100 each, Rfl: 5   Lancets MISC, To check blood sugar tid dx code 250.00 accu check meter, Disp: 100 each, Rfl: 5   lisinopril (PRINIVIL,ZESTRIL) 5 MG tablet, Take 1 tablet (5 mg total) by mouth daily., Disp: 30 tablet, Rfl: 5   losartan (COZAAR) 100 MG tablet, Take 100 mg by mouth daily., Disp: , Rfl: 3   meclizine (ANTIVERT) 12.5 MG tablet, Take 1-2 tablets (12.5-25 mg total) by mouth 3 (three) times daily as needed for dizziness., Disp: 30 tablet, Rfl: 0   meloxicam (MOBIC) 7.5 MG tablet, Take 7.5 mg by mouth daily as needed for pain., Disp: , Rfl:    metFORMIN (GLUCOPHAGE) 500 MG tablet, Take 1 tablet (500 mg total) by mouth daily with breakfast., Disp: 60 tablet, Rfl: 0   gabapentin (NEURONTIN) 100  MG capsule, Take 1 capsule (100 mg total) by mouth 3 (three) times daily., Disp: 90 capsule, Rfl: 0   omeprazole (PRILOSEC) 20 MG capsule, Take 2 capsules (40 mg total) by mouth daily for 14 days., Disp: 28 capsule, Rfl: 0   ondansetron (ZOFRAN ODT) 4 MG disintegrating tablet, Take 1 tablet (4 mg total) by mouth every 8 (eight) hours as needed for nausea or vomiting., Disp: 20 tablet, Rfl: 0   Objective:   Vitals:   10/03/22 1102  BP: 123/76  Pulse: 93  SpO2: 96%    Physical Exam Vitals and nursing note reviewed.  Constitutional:      Appearance: Normal appearance.  HENT:     Head: Normocephalic and atraumatic.  Cardiovascular:     Rate and Rhythm: Normal rate.     Pulses: Normal pulses.  Pulmonary:     Effort: Pulmonary effort is normal.  Musculoskeletal:         General: No swelling or deformity.  Skin:    General: Skin is warm.     Capillary Refill: Capillary refill takes less than 2 seconds.     Coloration: Skin is not jaundiced.     Findings: No bruising or lesion.  Neurological:     Mental Status: She is alert and oriented to person, place, and time.  Psychiatric:        Mood and Affect: Mood normal.        Behavior: Behavior normal.        Thought Content: Thought content normal.        Judgment: Judgment normal.     Assessment & Plan:  Dermatochalasis of both upper eyelids  We will send the patient for a visual field exam and then we will talk to her again for evaluation but I think she would be a good candidate for upper lid blepharoplasty.  Pictures were obtained of the patient and placed in the chart with the patient's or guardian's permission.   Alena Bills Stryder Poitra, DO

## 2022-11-18 ENCOUNTER — Encounter: Payer: Self-pay | Admitting: Plastic Surgery

## 2022-11-18 ENCOUNTER — Ambulatory Visit (INDEPENDENT_AMBULATORY_CARE_PROVIDER_SITE_OTHER): Payer: 59 | Admitting: Plastic Surgery

## 2022-11-18 DIAGNOSIS — H02831 Dermatochalasis of right upper eyelid: Secondary | ICD-10-CM

## 2022-11-18 DIAGNOSIS — H02834 Dermatochalasis of left upper eyelid: Secondary | ICD-10-CM

## 2022-11-18 NOTE — Progress Notes (Signed)
   Subjective:    Patient ID: Barbara Grant, female    DOB: 02-28-1946, 77 y.o.   MRN: [id number]  The patient is a 77 year old female joining me by phone.  She has a history of dermatochalasis of her upper lids.  She also has a history of diabetes, hypertension, arthritis and reflux.  She is not a smoker and her last hemoglobin A1c in November was 7.2.  She is 5 feet 5 inches tall weighs 160 pounds.  She complains of hooding and a decrease in her visual field.  She was sent for visual field exam and that has not happened yet so I will check into getting that arranged for her.      Review of Systems     Objective:   Physical Exam      Assessment & Plan:   Have sent a note to staff to arrange for visual field exam again for this patient.  Will also set up another televisit.  I connected with  Barbara Grant on 11/18/22 by phone and verified that I am speaking with the correct person using two identifiers.  The patient was at home and I was at the office.  We spent less than 5 minutes in discussion.   I discussed the limitations of evaluation and management by telemedicine. The patient expressed understanding and agreed to proceed.

## 2023-02-05 ENCOUNTER — Telehealth: Payer: Self-pay | Admitting: Plastic Surgery

## 2023-02-05 DIAGNOSIS — H02831 Dermatochalasis of right upper eyelid: Secondary | ICD-10-CM

## 2023-02-05 NOTE — Telephone Encounter (Signed)
Surgery request

## 2023-04-22 ENCOUNTER — Emergency Department (HOSPITAL_COMMUNITY): Payer: 59

## 2023-04-22 ENCOUNTER — Emergency Department (HOSPITAL_COMMUNITY)
Admission: EM | Admit: 2023-04-22 | Discharge: 2023-04-23 | Disposition: A | Discharge status: Home / Self Care | Payer: 59 | Attending: Emergency Medicine | Admitting: Emergency Medicine

## 2023-04-22 ENCOUNTER — Encounter (HOSPITAL_COMMUNITY): Payer: Self-pay | Admitting: Emergency Medicine

## 2023-04-22 DIAGNOSIS — M5412 Radiculopathy, cervical region: Secondary | ICD-10-CM | POA: Diagnosis not present

## 2023-04-22 DIAGNOSIS — R531 Weakness: Secondary | ICD-10-CM | POA: Diagnosis not present

## 2023-04-22 DIAGNOSIS — M79602 Pain in left arm: Secondary | ICD-10-CM | POA: Diagnosis present

## 2023-04-22 DIAGNOSIS — E119 Type 2 diabetes mellitus without complications: Secondary | ICD-10-CM | POA: Insufficient documentation

## 2023-04-22 DIAGNOSIS — I10 Essential (primary) hypertension: Secondary | ICD-10-CM | POA: Insufficient documentation

## 2023-04-22 DIAGNOSIS — Z7984 Long term (current) use of oral hypoglycemic drugs: Secondary | ICD-10-CM | POA: Insufficient documentation

## 2023-04-22 DIAGNOSIS — Z79899 Other long term (current) drug therapy: Secondary | ICD-10-CM | POA: Insufficient documentation

## 2023-04-22 LAB — CBC WITH DIFFERENTIAL/PLATELET
Abs Immature Granulocytes: 0.01 10*3/uL (ref 0.00–0.07)
Basophils Absolute: 0 10*3/uL (ref 0.0–0.1)
Basophils Relative: 0 %
Eosinophils Absolute: 0 10*3/uL (ref 0.0–0.5)
Eosinophils Relative: 0 %
HCT: 35 % — ABNORMAL LOW (ref 36.0–46.0)
Hemoglobin: 11.7 g/dL — ABNORMAL LOW (ref 12.0–15.0)
Immature Granulocytes: 0 %
Lymphocytes Relative: 50 %
Lymphs Abs: 3.6 10*3/uL (ref 0.7–4.0)
MCH: 30.6 pg (ref 26.0–34.0)
MCHC: 33.4 g/dL (ref 30.0–36.0)
MCV: 91.6 fL (ref 80.0–100.0)
Monocytes Absolute: 0.6 10*3/uL (ref 0.1–1.0)
Monocytes Relative: 8 %
Neutro Abs: 3.1 10*3/uL (ref 1.7–7.7)
Neutrophils Relative %: 42 %
Platelets: 183 10*3/uL (ref 150–400)
RBC: 3.82 MIL/uL — ABNORMAL LOW (ref 3.87–5.11)
RDW: 13.5 % (ref 11.5–15.5)
WBC: 7.3 10*3/uL (ref 4.0–10.5)
nRBC: 0 % (ref 0.0–0.2)

## 2023-04-22 NOTE — ED Triage Notes (Addendum)
Pt reports left arm pain from shoulder to hand starting yesterday that just came out of the blue. Denies injury. She says she cannot lift arm up bc too painful. Grip strength less in left hand than right. No other symptoms. Denies numbness. Reports arm was last normal around 5/6pm 04/21/23.

## 2023-04-23 ENCOUNTER — Emergency Department (HOSPITAL_COMMUNITY): Payer: 59

## 2023-04-23 DIAGNOSIS — M5412 Radiculopathy, cervical region: Secondary | ICD-10-CM | POA: Diagnosis not present

## 2023-04-23 LAB — COMPREHENSIVE METABOLIC PANEL
ALT: 25 U/L (ref 0–44)
AST: 26 U/L (ref 15–41)
Albumin: 4 g/dL (ref 3.5–5.0)
Alkaline Phosphatase: 112 U/L (ref 38–126)
Anion gap: 7 (ref 5–15)
BUN: 23 mg/dL (ref 8–23)
CO2: 31 mmol/L (ref 22–32)
Calcium: 9.3 mg/dL (ref 8.9–10.3)
Chloride: 98 mmol/L (ref 98–111)
Creatinine, Ser: 0.76 mg/dL (ref 0.44–1.00)
GFR, Estimated: >60 mL/min (ref >60)
Glucose, Bld: 145 mg/dL — ABNORMAL HIGH (ref 70–99)
Potassium: 4.2 mmol/L (ref 3.5–5.1)
Sodium: 136 mmol/L (ref 135–145)
Total Bilirubin: 0.5 mg/dL (ref <1.2)
Total Protein: 7 g/dL (ref 6.5–8.1)

## 2023-04-23 LAB — TROPONIN I (HIGH SENSITIVITY)
Troponin I (High Sensitivity): 3 ng/L (ref <18)
Troponin I (High Sensitivity): 3 ng/L (ref <18)

## 2023-04-23 MED ORDER — DEXAMETHASONE SODIUM PHOSPHATE 10 MG/ML IJ SOLN
10.0000 mg | Freq: Once | INTRAMUSCULAR | Status: AC
  Administered 2023-04-23: 10 mg via INTRAVENOUS
  Filled 2023-04-23: qty 1

## 2023-04-23 MED ORDER — METHOCARBAMOL 500 MG PO TABS
500.0000 mg | ORAL_TABLET | Freq: Once | ORAL | Status: AC
  Administered 2023-04-23: 500 mg via ORAL
  Filled 2023-04-23: qty 1

## 2023-04-23 MED ORDER — OXYCODONE-ACETAMINOPHEN 5-325 MG PO TABS
1.0000 | ORAL_TABLET | Freq: Once | ORAL | Status: AC
  Administered 2023-04-23: 1 via ORAL
  Filled 2023-04-23: qty 1

## 2023-04-23 MED ORDER — HYDROCODONE-ACETAMINOPHEN 5-325 MG PO TABS
1.0000 | ORAL_TABLET | Freq: Once | ORAL | Status: AC
  Administered 2023-04-23: 1 via ORAL
  Filled 2023-04-23: qty 1

## 2023-04-23 MED ORDER — METHYLPREDNISOLONE 4 MG PO TBPK: ORAL_TABLET | ORAL | 0 refills | Status: DC

## 2023-04-23 MED ORDER — METHOCARBAMOL 500 MG PO TABS: 500.0000 mg | ORAL_TABLET | Freq: Three times a day (TID) | ORAL | 0 refills | Status: AC | PRN

## 2023-04-23 NOTE — ED Provider Notes (Signed)
[facility] Provider Note   CSN: [id number] Arrival date & time: 04/22/23  2310     History  Chief Complaint  Patient presents with   Arm Injury    Barbara Grant is a 77 y.o. female.  Patient with hypertension, diabetes, GERD presenting with left arm pain and weakness ongoing since yesterday.  Denies any fall or injury.  Symptoms started about 5 PM on the evening of December 3.  She has weakness to her left hand and pain that radiates from her left neck all the way down to her arm.  Denies any headache.  Denies any numbness or tingling.  Denies any visual changes.  Denies any chest pain or shortness of breath.  She is never had this pain in the past.  She feels she is weak in the arm secondary to pain but not true weakness.  No fevers, chills, nausea or vomiting.  No weakness in her legs.  No difficulty speaking or difficulty swallowing.  no headache or visual changes. Tried some over-the-counter Tylenol/Benadryl without relief  The history is provided by the patient.  Arm Injury Associated symptoms: neck pain   Associated symptoms: no fever        Home Medications Prior to Admission medications   Medication Sig Start Date End Date Taking? Authorizing Provider  alendronate (FOSAMAX) 70 MG tablet Take 1 tablet (70 mg total) by mouth every 7 (seven) days. Take with a full glass of water on an empty stomach. 12/14/12   Copland, Gwenlyn Found, MD  atorvastatin (LIPITOR) 40 MG tablet Take 40 mg by mouth daily. 04/24/17   [provider]  bisoprolol-hydrochlorothiazide (ZIAC) 5-6.25 MG per tablet Take 1 tablet by mouth daily. 12/14/12   Copland, Gwenlyn Found, MD  cyclobenzaprine (FLEXERIL) 10 MG tablet Take 1 tablet (10 mg total) by mouth 2 (two) times daily as needed for muscle spasms. 04/30/17   Alvira Monday, MD  furosemide (LASIX) 20 MG tablet Take 20 mg by mouth daily. 04/29/17   [provider]  gabapentin (NEURONTIN) 100  MG capsule Take 1 capsule (100 mg total) by mouth 3 (three) times daily. 09/01/17 10/01/17  Alvira Monday, MD  glipiZIDE (GLUCOTROL) 10 MG tablet Take 10 mg by mouth 2 (two) times daily before a meal.  03/02/17   [provider]  glucose blood (CHOICE DM FORA G20 TEST STRIPS) test strip Use as instructed 12/20/12   Nelva Nay, PA-C  Lancets MISC To check blood sugar tid dx code 250.00 accu check meter 12/20/12   Rhoderick Moody M, PA-C  lisinopril (PRINIVIL,ZESTRIL) 5 MG tablet Take 1 tablet (5 mg total) by mouth daily. 12/14/12   Copland, Gwenlyn Found, MD  losartan (COZAAR) 100 MG tablet Take 100 mg by mouth daily. 03/02/17   [provider]  meclizine (ANTIVERT) 12.5 MG tablet Take 1-2 tablets (12.5-25 mg total) by mouth 3 (three) times daily as needed for dizziness. 12/11/21   Blue, Soijett A, PA-C  meloxicam (MOBIC) 7.5 MG tablet Take 7.5 mg by mouth daily as needed for pain.    [provider]  metFORMIN (GLUCOPHAGE) 500 MG tablet Take 1 tablet (500 mg total) by mouth daily with breakfast. 06/11/19   Sharman Cheek, MD  omeprazole (PRILOSEC) 20 MG capsule Take 2 capsules (40 mg total) by mouth daily for 14 days. 05/07/21 05/21/21  Alvira Monday, MD  ondansetron (ZOFRAN ODT) 4 MG disintegrating tablet Take 1 tablet (4 mg total) by mouth every 8 (  eight) hours as needed for nausea or vomiting. 06/11/19   Sharman Cheek, MD      Allergies    Patient has no known allergies.    Review of Systems   Review of Systems  Constitutional:  Negative for activity change, appetite change and fever.  HENT:  Negative for congestion.   Eyes:  Negative for visual disturbance.  Respiratory:  Negative for cough and shortness of breath.   Cardiovascular:  Negative for chest pain.  Gastrointestinal:  Negative for abdominal pain, nausea and vomiting.  Genitourinary:  Negative for dysuria and hematuria.  Musculoskeletal:  Positive for neck pain. Negative for arthralgias and myalgias.   Skin:  Negative for rash.  Neurological:  Positive for weakness and numbness. Negative for headaches.   all other systems are negative except as noted in the HPI and PMH.    Physical Exam Updated Vital Signs BP 115/71 (BP Location: Left Arm)   Pulse 80   Temp 97.9 F (36.6 C) (Oral)   Resp 18   Ht 5\' 5"  (1.651 m)   Wt 70.3 kg   SpO2 94%   BMI 25.79 kg/m  Physical Exam Vitals and nursing note reviewed.  Constitutional:      General: She is not in acute distress.    Appearance: She is well-developed.  HENT:     Head: Normocephalic and atraumatic.     Mouth/Throat:     Pharynx: No oropharyngeal exudate.  Eyes:     Conjunctiva/sclera: Conjunctivae normal.     Pupils: Pupils are equal, round, and reactive to light.  Neck:     Comments: No meningismus. Cardiovascular:     Rate and Rhythm: Normal rate and regular rhythm.     Heart sounds: Normal heart sounds. No murmur heard. Pulmonary:     Effort: Pulmonary effort is normal. No respiratory distress.     Breath sounds: Normal breath sounds.  Abdominal:     Palpations: Abdomen is soft.     Tenderness: There is no abdominal tenderness. There is no guarding or rebound.  Musculoskeletal:        General: No tenderness. Normal range of motion.     Cervical back: Normal range of motion and neck supple.  Skin:    General: Skin is warm.  Neurological:     Mental Status: She is alert and oriented to person, place, and time.     Cranial Nerves: No cranial nerve deficit.     Motor: No abnormal muscle tone.     Coordination: Coordination normal.     Comments: Cranial nerves II to XII intact, no facial droop, tongue is midline.  Decreased grip strength on the left side.  Decreased strength in forearm flexion and extension.  No weakness in lower extremities.  5/5 strength in bilateral lower extremities.  Normal grip strength on right.  No facial droop.  Tongue is midline.  Psychiatric:        Behavior: Behavior normal.     ED  Results / Procedures / Treatments   Labs (all labs ordered are listed, but only abnormal results are displayed) Labs Reviewed  COMPREHENSIVE METABOLIC PANEL - Abnormal; Notable for the following components:      Result Value   Glucose, Bld 145 (*)    All other components within normal limits  CBC WITH DIFFERENTIAL/PLATELET - Abnormal; Notable for the following components:   RBC 3.82 (*)    Hemoglobin 11.7 (*)    HCT 35.0 (*)    All other components  within normal limits  TROPONIN I (HIGH SENSITIVITY)  TROPONIN I (HIGH SENSITIVITY)    EKG EKG Interpretation Date/Time:  Thursday April 23 2023 01:15:55 EST Ventricular Rate:  70 PR Interval:  157 QRS Duration:  96 QT Interval:  433 QTC Calculation: 468 R Axis:   22  Text Interpretation: Sinus rhythm Abnormal R-wave progression, early transition No significant change was found Confirmed by Glynn Octave [phone]) on 04/23/2023 1:52:41 AM  Radiology MR Cervical Spine Wo Contrast  Result Date: 04/23/2023 CLINICAL DATA:  Left arm weakness and pain EXAM: MRI HEAD WITHOUT CONTRAST MRI CERVICAL SPINE WITHOUT CONTRAST TECHNIQUE: Multiplanar, multiecho pulse sequences of the brain and surrounding structures, and cervical spine, to include the craniocervical junction and cervicothoracic junction, were obtained without intravenous contrast. COMPARISON:  None Available. FINDINGS: MRI HEAD FINDINGS Brain: No acute infarct, mass effect or extra-axial collection. No chronic microhemorrhage or siderosis. Normal white matter signal, parenchymal volume and CSF spaces. The midline structures are normal. 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 Vascular: Normal flow voids. Skull and upper cervical spine: Normal marrow signal. Sinuses/Orbits: Negative. Other: None. MRI CERVICAL SPINE FINDINGS Alignment: Physiologic. Vertebrae: No fracture, evidence of discitis, or bone lesion. Cord: Normal signal and morphology. Posterior Fossa, vertebral  arteries, paraspinal tissues: Old right cerebellar infarcts as above Disc levels: C1-2: Unremarkable. C2-3: Normal disc space and facet joints. There is no spinal canal stenosis. No neural foraminal stenosis. C3-4: Normal disc space and facet joints. There is no spinal canal stenosis. No neural foraminal stenosis. C4-5: Intermediate sized disc bulge with bilateral uncovertebral hypertrophy. There is no spinal canal stenosis. Moderate right and severe left neural foraminal stenosis. C5-6: Intermediate sized disc bulge with bilateral uncovertebral hypertrophy. There is no spinal canal stenosis. Moderate bilateral neural foraminal stenosis. C6-7: Small central disc protrusion. There is no spinal canal stenosis. No neural foraminal stenosis. C7-T1: Normal disc space and facet joints. There is no spinal canal stenosis. No neural foraminal stenosis. IMPRESSION: 1. No acute intracranial abnormality. 2. 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 3. 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 4. Moderate bilateral C5-6 neural foraminal stenosis. 5. No spinal canal stenosis. Electronically Signed   By: Deatra Robinson M.D.   On: 04/23/2023 03:53   MR BRAIN WO CONTRAST  Result Date: 04/23/2023 CLINICAL DATA:  Left arm weakness and pain EXAM: MRI HEAD WITHOUT CONTRAST MRI CERVICAL SPINE WITHOUT CONTRAST TECHNIQUE: Multiplanar, multiecho pulse sequences of the brain and surrounding structures, and cervical spine, to include the craniocervical junction and cervicothoracic junction, were obtained without intravenous contrast. COMPARISON:  None Available. FINDINGS: MRI HEAD FINDINGS Brain: No acute infarct, mass effect or extra-axial collection. No chronic microhemorrhage or siderosis. Normal white matter signal, parenchymal volume and CSF spaces. The midline structures are normal. 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 Vascular: Normal flow voids. Skull and upper cervical spine: Normal  marrow signal. Sinuses/Orbits: Negative. Other: None. MRI CERVICAL SPINE FINDINGS Alignment: Physiologic. Vertebrae: No fracture, evidence of discitis, or bone lesion. Cord: Normal signal and morphology. Posterior Fossa, vertebral arteries, paraspinal tissues: Old right cerebellar infarcts as above Disc levels: C1-2: Unremarkable. C2-3: Normal disc space and facet joints. There is no spinal canal stenosis. No neural foraminal stenosis. C3-4: Normal disc space and facet joints. There is no spinal canal stenosis. No neural foraminal stenosis. C4-5: Intermediate sized disc bulge with bilateral uncovertebral hypertrophy. There is no spinal canal stenosis. Moderate right and severe left neural foraminal stenosis. C5-6: Intermediate sized  disc bulge with bilateral uncovertebral hypertrophy. There is no spinal canal stenosis. Moderate bilateral neural foraminal stenosis. C6-7: Small central disc protrusion. There is no spinal canal stenosis. No neural foraminal stenosis. C7-T1: Normal disc space and facet joints. There is no spinal canal stenosis. No neural foraminal stenosis. IMPRESSION: 1. No acute intracranial abnormality. 2. 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 3. 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 4. Moderate bilateral C5-6 neural foraminal stenosis. 5. No spinal canal stenosis. Electronically Signed   By: Deatra Robinson M.D.   On: 04/23/2023 03:53   DG Shoulder Left  Result Date: 04/23/2023 CLINICAL DATA:  Left shoulder pain for 1 week. No injury. Limited range of motion. EXAM: LEFT SHOULDER - 2+ VIEW COMPARISON:  11/24/2022 FINDINGS: Mild degenerative changes in the glenohumeral and acromioclavicular joints. No evidence of acute fracture or dislocation. No focal bone lesion or bone destruction. Coracoclavicular and acromioclavicular spaces are normal. Old left anterior rib fracture. IMPRESSION: Mild degenerative changes in the left shoulder. No acute fractures identified.  Electronically Signed   By: Burman Nieves M.D.   On: 04/23/2023 00:22    Procedures Procedures    Medications Ordered in ED Medications  methocarbamol (ROBAXIN) tablet 500 mg (has no administration in time range)  dexamethasone (DECADRON) injection 10 mg (has no administration in time range)  HYDROcodone-acetaminophen (NORCO/VICODIN) 5-325 MG per tablet 1 tablet (has no administration in time range)    ED Course/ Medical Decision Making/ A&P                                 Medical Decision Making Amount and/or Complexity of Data Reviewed Labs: ordered. Decision-making details documented in ED Course. Radiology: ordered and independent interpretation performed. Decision-making details documented in ED Course. ECG/medicine tests: ordered and independent interpretation performed. Decision-making details documented in ED Course.  Risk Prescription drug management.   Left arm pain and weakness for the past 24 hours.  No other neurological deficits.  No chest pain or shortness of breath.  No ischemia on EKG with low suspicion for ACS.  Favor cervical radiculopathy.  Out of window for intervention for stroke  EKG without acute ischemia.  Low suspicion for ACS.  Imaging is obtained to evaluate for stroke versus cervical radiculopathy giving her left arm pain and weakness.  She is given steroids and muscle relaxers.MRI negative for infarct.  Does show multilevel foraminal stenosis especially at C4/C5 as well as C5 and C6.  On recheck, patient strength is improved.  Will treat for cervical radiculopathy with steroids and muscle relaxers.  Follow-up with neurosurgery.  Troponin negative x 2 with low suspicion for ACS.  Cautioned to watch her blood sugars closely while she is taking steroids. Return to the ED for worsening pain, weakness, numbness, tingling or other concerns       Final Clinical Impression(s) / ED Diagnoses Final diagnoses:  None    Rx / DC Orders ED  Discharge Orders          Ordered    methylPREDNISolone (MEDROL DOSEPAK) 4 MG TBPK tablet        04/23/23 0626    methocarbamol (ROBAXIN) 500 MG tablet  Every 8 hours PRN        04/23/23 0626              Glynn Octave, MD 04/23/23 0900

## 2023-04-23 NOTE — Discharge Instructions (Signed)
Your testing shows no evidence of stroke or heart attack.  You do have several areas in your neck that are causing pinched nerves going down your arm which is likely the source of your pain.  Watch your blood sugars carefully while taking the steroids.  Follow-up with the spine doctor.  Steroids may make your blood sugar elevate more than usual.  Return to the ED with worsening pain, weakness, numbness, tingling, other concerns.

## 2023-04-23 NOTE — ED Notes (Signed)
Nurse able to get a line in pt. Nurse enlisted the aide of a Nutritional therapist on unit. Med admin delay.

## 2023-05-29 ENCOUNTER — Ambulatory Visit (INDEPENDENT_AMBULATORY_CARE_PROVIDER_SITE_OTHER): Payer: 59 | Admitting: Plastic Surgery

## 2023-05-29 ENCOUNTER — Encounter: Payer: Self-pay | Admitting: Plastic Surgery

## 2023-05-29 DIAGNOSIS — H02831 Dermatochalasis of right upper eyelid: Secondary | ICD-10-CM

## 2023-05-29 DIAGNOSIS — H02834 Dermatochalasis of left upper eyelid: Secondary | ICD-10-CM

## 2023-05-29 NOTE — Progress Notes (Signed)
   Subjective:    Patient ID: Barbara Grant, female    DOB: 22-Dec-1945, 78 y.o.   MRN: [id number]  The patient is a 78 year old female here for further evaluation of her eyelids.  Past medical history is positive for diabetes, hypertension, arthritis and reflux.  She has had surgery in the past without trouble when she had a hernia repair.  She is not a smoker and her last hemoglobin A1c was in 2023 and was 7.2.  She complains of difficulty with her visual field.  She has hooding and it gets worse in the evening.  She could immediately tell the difference on her last exam when we taped her eyelids open.  Her visual field exam is indicative of improvement with the taping.    Review of Systems  Constitutional: Negative.   Eyes: Negative.   Respiratory: Negative.    Cardiovascular: Negative.        Objective:   Physical Exam Constitutional:      Appearance: Normal appearance.  Skin:    Capillary Refill: Capillary refill takes less than 2 seconds.  Neurological:     Mental Status: She is oriented to person, place, and time.  Psychiatric:        Mood and Affect: Mood normal.        Behavior: Behavior normal.        Thought Content: Thought content normal.        Judgment: Judgment normal.        Assessment & Plan:     ICD-10-CM   1. Dermatochalasis of both upper eyelids  H02.831    H02.834       Plan for bilateral upper lid blepharoplasty.

## 2023-06-05 ENCOUNTER — Telehealth: Payer: Self-pay | Admitting: Surgical

## 2023-06-05 ENCOUNTER — Ambulatory Visit (INDEPENDENT_AMBULATORY_CARE_PROVIDER_SITE_OTHER): Payer: 59 | Admitting: Surgical

## 2023-06-05 ENCOUNTER — Encounter: Payer: Self-pay | Admitting: Surgical

## 2023-06-05 VITALS — BP 141/79 | HR 83

## 2023-06-05 DIAGNOSIS — H02834 Dermatochalasis of left upper eyelid: Secondary | ICD-10-CM

## 2023-06-05 DIAGNOSIS — H02831 Dermatochalasis of right upper eyelid: Secondary | ICD-10-CM

## 2023-06-05 MED ORDER — CEPHALEXIN 500 MG PO CAPS: 500.0000 mg | ORAL_CAPSULE | Freq: Four times a day (QID) | ORAL | 0 refills | Status: AC

## 2023-06-05 MED ORDER — ONDANSETRON HCL 4 MG PO TABS: 4.0000 mg | ORAL_TABLET | Freq: Three times a day (TID) | ORAL | 0 refills | Status: AC | PRN

## 2023-06-05 MED ORDER — OXYCODONE HCL 5 MG PO TABS: 5.0000 mg | ORAL_TABLET | Freq: Four times a day (QID) | ORAL | 0 refills | Status: AC | PRN

## 2023-06-05 NOTE — Progress Notes (Signed)
Patient ID: Barbara Grant, female    DOB: 01-21-1946, 78 y.o.   MRN: [id number]  Chief Complaint  Patient presents with   Pre-op Exam      ICD-10-CM   1. Dermatochalasis of both upper eyelids  H02.831    H02.834       History of Present Illness: Barbara Grant is a 78 y.o.  female  with a history of dermatochalasis.  She presents for preoperative evaluation for upcoming procedure, upper eyelid blepharoplasty-bilaterally, scheduled for 06/29/2023 with Dr. Ulice Bold.  The patient has not had problems with anesthesia. No history of DVT/PE.  No family history of DVT/PE.  No family or personal history of bleeding or clotting disorders.  Patient is not currently taking any blood thinners.  No history of CVA/MI.   Patient does report that she has a history of lower extremity swelling, she is on Lasix for this.  PMH Significant for: Hypertension, hyperlipidemia, diabetes mellitus with most recent A1c 8.4.  Glaucoma. She also has a history of dry eye.  She also has a history of cataracts.  Patient reports that her A1c was elevated due to a medication that she received while hospitalized, she was not sure what medication this was.  She reports her blood sugars have been under much better control recently with recent blood sugars around 90-145.  Patient does report that she is Jehovah witness and does not want any blood transfusions if necessary.  Patient reports she has been feeling well lately, no recent changes to her health.  She reports she does not have any cardiac or pulmonary disease.  She reports she is not taking any blood thinners.  Entire encounter was completed with Spanish interpreter present via video.  Debarah Crape was the [place] interpreter.   Past Medical History: Allergies: No Known Allergies  Current Medications:  Current Outpatient Medications:    alendronate (FOSAMAX) 70 MG tablet, Take 1 tablet (70 mg total) by mouth every 7 (seven) days. Take with a full glass of  water on an empty stomach., Disp: 4 tablet, Rfl: 5   atorvastatin (LIPITOR) 40 MG tablet, Take 40 mg by mouth daily., Disp: , Rfl:    bisoprolol-hydrochlorothiazide (ZIAC) 5-6.25 MG per tablet, Take 1 tablet by mouth daily., Disp: 30 tablet, Rfl: 5   cyclobenzaprine (FLEXERIL) 10 MG tablet, Take 1 tablet (10 mg total) by mouth 2 (two) times daily as needed for muscle spasms., Disp: 20 tablet, Rfl: 0   furosemide (LASIX) 20 MG tablet, Take 20 mg by mouth daily., Disp: , Rfl:    glipiZIDE (GLUCOTROL) 10 MG tablet, Take 10 mg by mouth 2 (two) times daily before a meal. , Disp: , Rfl: 3   glucose blood (CHOICE DM FORA G20 TEST STRIPS) test strip, Use as instructed, Disp: 100 each, Rfl: 5   Lancets MISC, To check blood sugar tid dx code 250.00 accu check meter, Disp: 100 each, Rfl: 5   lisinopril (PRINIVIL,ZESTRIL) 5 MG tablet, Take 1 tablet (5 mg total) by mouth daily., Disp: 30 tablet, Rfl: 5   losartan (COZAAR) 100 MG tablet, Take 100 mg by mouth daily., Disp: , Rfl: 3   meclizine (ANTIVERT) 12.5 MG tablet, Take 1-2 tablets (12.5-25 mg total) by mouth 3 (three) times daily as needed for dizziness., Disp: 30 tablet, Rfl: 0   meloxicam (MOBIC) 7.5 MG tablet, Take 7.5 mg by mouth daily as needed for pain., Disp: , Rfl:    metFORMIN (GLUCOPHAGE) 500 MG tablet, Take 1 tablet (500  mg total) by mouth daily with breakfast., Disp: 60 tablet, Rfl: 0   methocarbamol (ROBAXIN) 500 MG tablet, Take 1 tablet (500 mg total) by mouth every 8 (eight) hours as needed for muscle spasms., Disp: 20 tablet, Rfl: 0   gabapentin (NEURONTIN) 100 MG capsule, Take 1 capsule (100 mg total) by mouth 3 (three) times daily., Disp: 90 capsule, Rfl: 0   omeprazole (PRILOSEC) 20 MG capsule, Take 2 capsules (40 mg total) by mouth daily for 14 days., Disp: 28 capsule, Rfl: 0  Past Medical Problems: Past Medical History:  Diagnosis Date   Arthritis    Diabetes mellitus    Diabetes mellitus without complication (HCC)    GERD  (gastroesophageal reflux disease)    HTN (hypertension)    Hypertension    Osteoporosis     Past Surgical History: Past Surgical History:  Procedure Laterality Date   HERNIA REPAIR      Social History: Social History   Socioeconomic History   Marital status: Single    Spouse name: Not on file   Number of children: 1   Years of education: Not on file   Highest education level: Not on file  Occupational History   Occupation: Hair Stylist    Employer: [employer]  Tobacco Use   Smoking status: Never   Smokeless tobacco: Never  Vaping Use   Vaping status: Never Used  Substance and Sexual Activity   Alcohol use: No   Drug use: No   Sexual activity: Not on file  Other Topics Concern   Not on file  Social History Narrative   ** Merged History Encounter **       Social Drivers of Health   Financial Resource Strain: Not on file  Food Insecurity: Low Risk  (01/12/2023)   Received from [facility]   Hunger Vital Sign    Worried About Running Out of Food in the Last Year: Never true    Ran Out of Food in the Last Year: Never true  Transportation Needs: No Transportation Needs (01/12/2023)   Received from [facility]    In the past 12 months, has lack of reliable transportation kept you from medical appointments, meetings, work or from getting things needed for daily living? : No  Physical Activity: Not on file  Stress: Not on file  Social Connections: Not on file  Intimate Partner Violence: Not on file    Family History: Family History  Problem Relation Age of Onset   Colon cancer Mother    Hypertension Mother    Colon cancer Sister    Diabetes Sister     Review of Systems: Review of Systems  Constitutional: Negative.   Respiratory: Negative.    Cardiovascular: Negative.   Gastrointestinal: Negative.   Neurological: Negative.     Physical Exam: Vital Signs BP (!) 141/79 (BP Location: Left Arm, Patient Position: Sitting, Cuff  Size: Normal)   Pulse 83   SpO2 98%   Physical Exam Constitutional:      General: Not in acute distress.    Appearance: Normal appearance. Not ill-appearing.  HENT:     Head: Normocephalic and atraumatic.  Eyes:     Pupils: Pupils are equal, round Neck:     Musculoskeletal: Normal range of motion.  Cardiovascular:     Rate and Rhythm: Normal rate    Pulses: Normal pulses.  Pulmonary:     Effort: Pulmonary effort is normal. No respiratory distress.  Abdominal:  General: Abdomen is flat. There is no distension.  Musculoskeletal: Normal range of motion.  Skin:    General: Skin is warm and dry.     Findings: No erythema or rash.  Neurological:     General: No focal deficit present.     Mental Status: Alert and oriented to person, place, and time. Mental status is at baseline.     Motor: No weakness.  Psychiatric:        Mood and Affect: Mood normal.        Behavior: Behavior normal.    Assessment/Plan: The patient is scheduled for bilateral upper eyelid blepharoplasty with Dr. Ulice Bold.  Risks, benefits, and alternatives of procedure discussed, questions answered and consent obtained.    Smoking Status: Non-smoker; Counseling Given?  N/A  Caprini Score: 7, high; Risk Factors include: Age, BMI > 25, and length of planned surgery. Recommendation for mechanical  prophylaxis. Encourage early ambulation.   Pictures obtained: @consult   Post-op Rx sent to pharmacy: Oxycodone, Zofran, Keflex  Patient was provided with the General Surgical Risk consent document and Pain Medication Agreement prior to their appointment.  They had adequate time to read through the risk consent documents and Pain Medication Agreement. We also discussed them in person together during this preop appointment. All of their questions were answered to their satisfaction.  Recommended calling if they have any further questions.  Risk consent form and Pain Medication Agreement to be scanned into patient's  chart.  The risks that can be encountered with and after a blepharoplasty were discussed and include the following but no limited to these:  Asymmetry, dry eyes, lid lag, sensitivity to sun or bright light, difficulty closing your eyes, outward rolling of the eyelid, change in vision, fluid accumulation, firmness of the area, fat necrosis with death of fat tissue, bleeding, infection, delayed healing, anesthesia risks, skin sensation changes, injury to structures including nerves, blood vessels, and muscles which may be temporary or permanent, hair loss, allergies to tape, suture materials and glues, blood products, topical preparations or injected agents, skin and contour irregularities, skin discoloration and swelling, deep vein thrombosis, cardiac and pulmonary complications, pain, which may persist, persistent pain, recurrence, poor healing of the incision, possible need for revisional surgery or staged procedures. Thiere can also be persistent swelling, poor wound healing, rippling or loose skin, swelling. Any change in weight fluctuations can alter the outcome.   Electronically signed by: Kermit Balo Blanchard [place], PA-C 06/05/2023 2:03 PM

## 2023-06-29 DIAGNOSIS — H02834 Dermatochalasis of left upper eyelid: Secondary | ICD-10-CM | POA: Diagnosis not present

## 2023-06-29 DIAGNOSIS — H02831 Dermatochalasis of right upper eyelid: Secondary | ICD-10-CM | POA: Diagnosis not present

## 2023-06-30 ENCOUNTER — Encounter: Payer: 59 | Admitting: Physician Assistant

## 2023-06-30 NOTE — Progress Notes (Deleted)
 Patient is a pleasant 78 year old female s/p upper blepharoplasty performed 06/29/2023 by Dr. Ulice Bold who presents to clinic for day 1 postoperative follow-up.

## 2023-07-07 ENCOUNTER — Encounter: Payer: Self-pay | Admitting: Plastic Surgery

## 2023-07-14 ENCOUNTER — Ambulatory Visit (INDEPENDENT_AMBULATORY_CARE_PROVIDER_SITE_OTHER): Payer: 59 | Admitting: Surgical

## 2023-07-14 DIAGNOSIS — H02831 Dermatochalasis of right upper eyelid: Secondary | ICD-10-CM

## 2023-07-14 DIAGNOSIS — H02834 Dermatochalasis of left upper eyelid: Secondary | ICD-10-CM

## 2023-07-14 NOTE — Progress Notes (Signed)
 78 year old female here for follow-up after bilateral upper eyelid blepharoplasty with Dr. Ulice Bold on 06/29/2023.  She is 2 weeks postop.  Of note she does have a history of diabetes mellitus, most recent A1c 8.4.  She also had a history of dry eye prior to surgery.  Patient reports she is overall doing well, she presents with Spanish interpreter today.  She is not having any infectious symptoms or vision concerns.  She reports her vision is normal, she is not having any issues with dry eye.  She does have questions about ongoing feeling of upper eyelid heaviness and interrupted vision.  She reports she feels as if she can still see her upper eyelid, particularly on the right side.  Chaperone present on exam On exam bilateral upper eyelid incisions are intact and very well-healed, there is no surrounding erythema or cellulitic changes noted.  She does have some mild ecchymosis present on the right inferior eyelid which appears to be resolving.  A/P:  Patient is doing well, no signs infection or concern on exam.  Incisions are healing well.  We discussed that she does continue to have some swelling as she is only 2 weeks postop and this will continue to improve over the next few weeks.  She should notice some improvement in the fullness of her upper eyelids as well with time as swelling goes down.  Recommend following up in about 4 weeks for reevaluation.  Recommend calling with questions or concerns.  Pictures were obtained of the patient and placed in the chart with the patient's or guardian's permission.

## 2023-07-28 ENCOUNTER — Encounter: Payer: 59 | Admitting: Surgical

## 2023-08-19 ENCOUNTER — Encounter: Payer: Self-pay | Admitting: Surgical

## 2023-08-19 ENCOUNTER — Ambulatory Visit (INDEPENDENT_AMBULATORY_CARE_PROVIDER_SITE_OTHER): Payer: 59 | Admitting: Surgical

## 2023-08-19 VITALS — BP 123/73 | HR 100

## 2023-08-19 DIAGNOSIS — H02831 Dermatochalasis of right upper eyelid: Secondary | ICD-10-CM

## 2023-08-19 DIAGNOSIS — H02834 Dermatochalasis of left upper eyelid: Secondary | ICD-10-CM

## 2023-08-19 NOTE — Progress Notes (Signed)
   Referring Provider Andreas Blower., MD [address] [place],  [place] [address]   CC:  Chief Complaint  Patient presents with   Post-op Follow-up      Barbara Grant is an 78 y.o. female.  HPI: Patient is a 78 year old female here for follow-up after upper eyelid blepharoplasty with Dr. Ulice Bold on 06/29/2023.  She is about 7 weeks postop.  At her last appointment she did report that she still felt as if she had heaviness of her upper eyelids and interrupted vision.  At that point, we discussed continuing to monitor the area for decreased swelling and recovery after surgery and to follow-up today to reevaluate.  Today she reports that she has noticed improvement.  She reports her vision is much better since her blepharoplasty.  She does feel as if she has asymmetry and ongoing heaviness.  She has questions about what can be done to improve this.  She does not have any other specific questions or concerns.  She is here today with Spanish interpreter.   Physical Exam    06/05/2023    1:12 PM 04/23/2023    6:30 AM 04/23/2023    5:00 AM  Vitals with BMI  Systolic 141 119 540  Diastolic 79 67 71  Pulse 83 77 70    General:  No acute distress,  Alert and oriented, Non-Toxic, Normal speech and affect Bilateral upper eyelid incisions are intact, well-healed.  She does have some heaviness of her bilateral brows.  There is no erythema or cellulitic changes.  Minimal, if any swelling noted.  Assessment/Plan Patient is well-healed after bilateral upper eyelid blepharoplasty 7 weeks ago.  She is still bothered by heaviness of her brows, discussed with patient that she should follow-up in a few months to discuss her concerns with Dr. Ulice Bold.   Discussed with patient that she may need a brow lift, but recommend discussing this further with surgeon.  Pictures were obtained of the patient and placed in the chart with the patient's or guardian's permission.  Kermit Balo  Barbara Grant 08/19/2023, 10:59 AM

## 2023-11-24 ENCOUNTER — Ambulatory Visit: Admitting: Plastic Surgery
